# Patient Record
Sex: Female | Born: 1940 | Race: White | Hispanic: No | Smoking: Never smoker
Health system: Southern US, Community
[De-identification: ages and names within clinical notes are randomized; demographics above are authoritative.]

## PROBLEM LIST (undated history)

## (undated) DIAGNOSIS — I509 Heart failure, unspecified: Secondary | ICD-10-CM

## (undated) DIAGNOSIS — C50912 Malignant neoplasm of unspecified site of left female breast: Secondary | ICD-10-CM

## (undated) DIAGNOSIS — I10 Essential (primary) hypertension: Secondary | ICD-10-CM

## (undated) DIAGNOSIS — E119 Type 2 diabetes mellitus without complications: Secondary | ICD-10-CM

## (undated) HISTORY — PX: CHOLECYSTECTOMY: SHX55

## (undated) HISTORY — PX: BREAST SURGERY: SHX581

## (undated) HISTORY — PX: CARDIAC CATHETERIZATION: SHX172

## (undated) HISTORY — PX: APPENDECTOMY: SHX54

---

## 2003-08-16 ENCOUNTER — Other Ambulatory Visit: Payer: Self-pay

## 2003-08-17 ENCOUNTER — Other Ambulatory Visit: Payer: Self-pay

## 2004-02-25 ENCOUNTER — Ambulatory Visit: Payer: Self-pay | Admitting: Internal Medicine

## 2004-06-16 ENCOUNTER — Ambulatory Visit: Payer: Self-pay | Admitting: Internal Medicine

## 2005-01-07 ENCOUNTER — Ambulatory Visit: Payer: Self-pay | Admitting: Family Medicine

## 2005-07-24 ENCOUNTER — Inpatient Hospital Stay: Payer: Self-pay | Admitting: Internal Medicine

## 2005-07-24 ENCOUNTER — Other Ambulatory Visit: Payer: Self-pay

## 2006-02-05 ENCOUNTER — Ambulatory Visit: Payer: Self-pay | Admitting: Family Medicine

## 2006-08-30 ENCOUNTER — Ambulatory Visit: Payer: Self-pay | Admitting: Family Medicine

## 2006-10-31 ENCOUNTER — Ambulatory Visit: Payer: Self-pay | Admitting: Emergency Medicine

## 2007-02-23 ENCOUNTER — Ambulatory Visit: Payer: Self-pay | Admitting: Emergency Medicine

## 2007-03-02 IMAGING — CR DG CHEST 2V
1 series · 2 of 2 positions shown · non-contrast
Comparison: none

REASON FOR EXAM: CHEST WALL PAIN
   CALL REPORT TO: [phone]
COMMENTS:

[Series 1: view not recorded · 0.17mm/px · 2 of 2 slices shown]
[im 1/2]
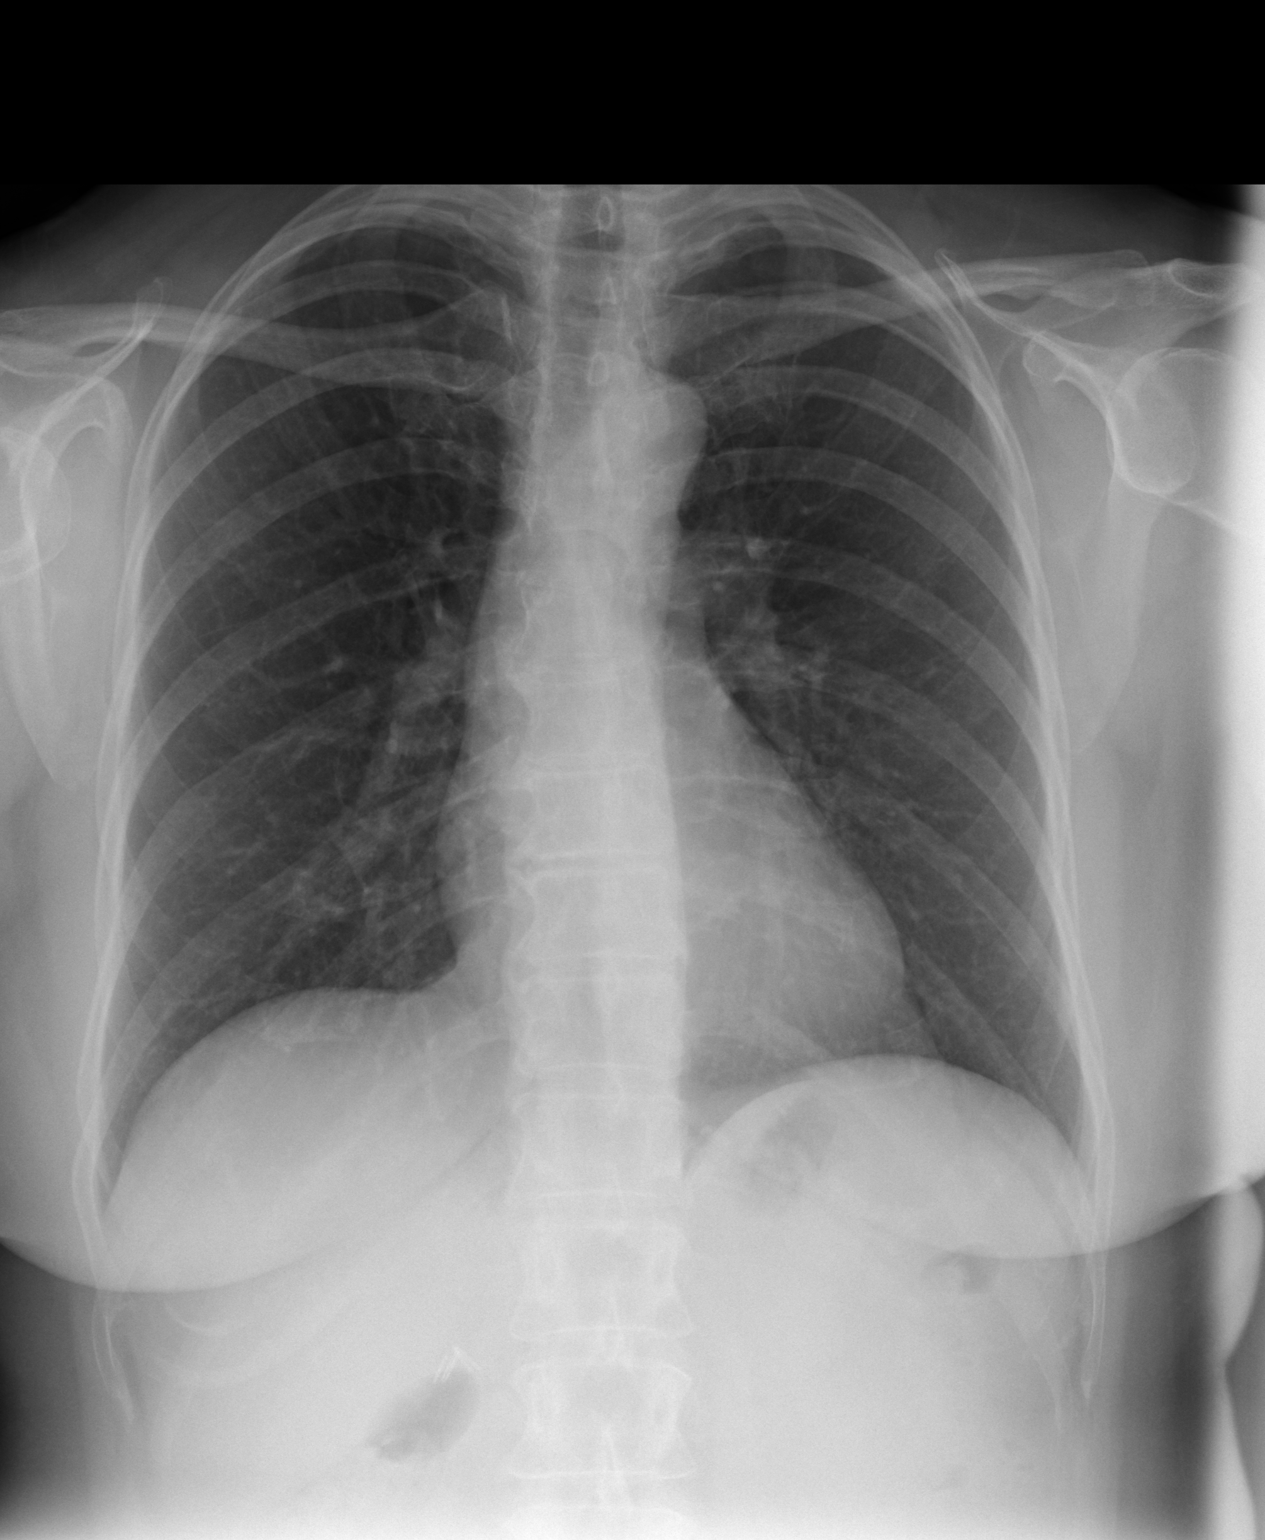
[im 2/2]
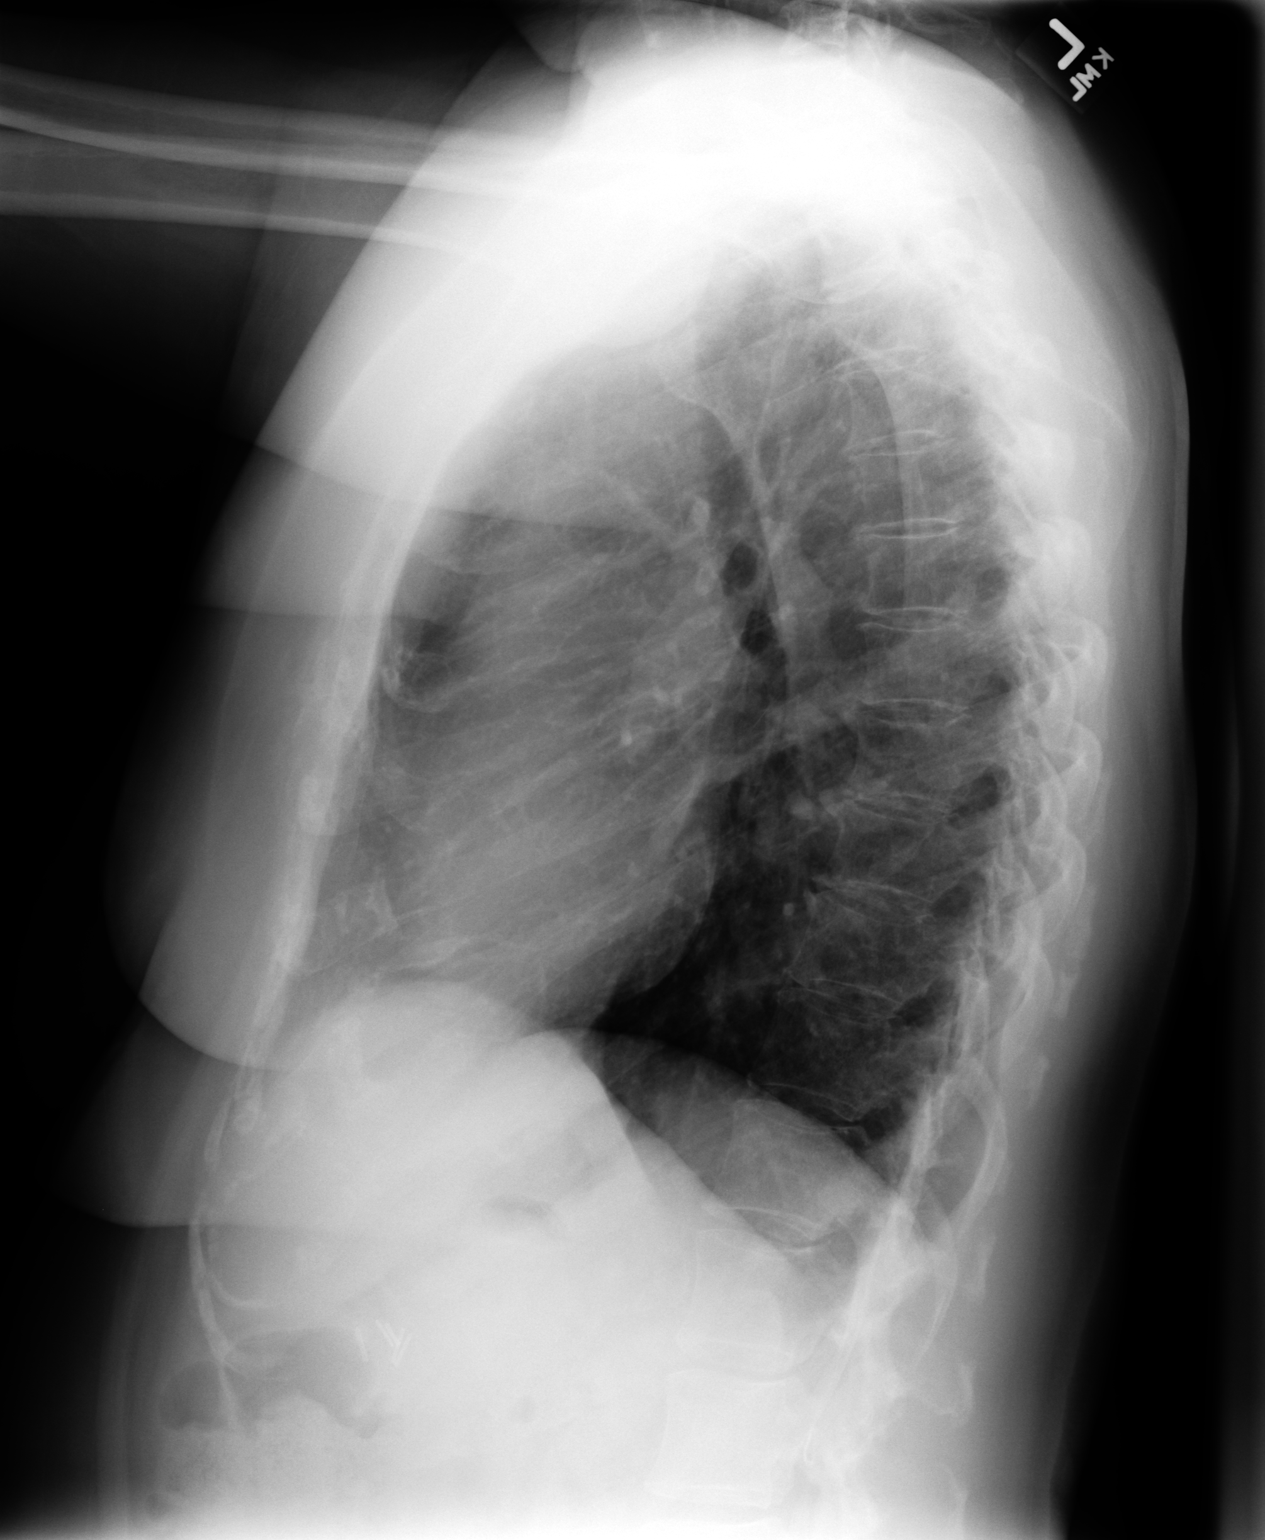

[2 of 2 positions shown; findings below may reference images not displayed]

PROCEDURE:     DXR - DXR CHEST PA (OR AP) AND LATERAL  - January 07, 2005  [DATE]

RESULT:     The current exam is compared to a prior portable AP exam of
08/17/2003.

The current exam shows the lung fields to be clear. No pneumonia,
pneumothorax or pleural effusion is seen. The heart and pulmonary
vasculature are normal in appearance. No acute bony abnormalities are seen.
There is slight hypertrophic spurring at multiple levels of the thoracic
spine.
IMPRESSION: No significant abnormalities are noted.

## 2007-09-01 ENCOUNTER — Ambulatory Visit: Payer: Self-pay | Admitting: Family Medicine

## 2007-09-15 ENCOUNTER — Ambulatory Visit: Payer: Self-pay | Admitting: Family Medicine

## 2008-02-17 ENCOUNTER — Inpatient Hospital Stay: Payer: Self-pay | Admitting: Internal Medicine

## 2008-02-18 ENCOUNTER — Other Ambulatory Visit: Payer: Self-pay

## 2008-02-20 ENCOUNTER — Inpatient Hospital Stay: Payer: Self-pay | Admitting: Internal Medicine

## 2008-02-21 ENCOUNTER — Other Ambulatory Visit: Payer: Self-pay

## 2008-06-26 ENCOUNTER — Ambulatory Visit: Payer: Self-pay | Admitting: Family Medicine

## 2008-09-17 ENCOUNTER — Ambulatory Visit: Payer: Self-pay | Admitting: Family Medicine

## 2009-07-15 ENCOUNTER — Ambulatory Visit: Payer: Self-pay | Admitting: Family Medicine

## 2009-10-16 ENCOUNTER — Ambulatory Visit: Payer: Self-pay | Admitting: Ophthalmology

## 2009-11-27 ENCOUNTER — Ambulatory Visit: Payer: Self-pay | Admitting: Ophthalmology

## 2010-04-15 ENCOUNTER — Ambulatory Visit: Payer: Self-pay | Admitting: Family Medicine

## 2010-04-24 ENCOUNTER — Ambulatory Visit: Payer: Self-pay | Admitting: Family Medicine

## 2010-10-03 ENCOUNTER — Ambulatory Visit: Payer: Self-pay | Admitting: Family Medicine

## 2010-10-15 ENCOUNTER — Ambulatory Visit: Payer: Self-pay | Admitting: Family Medicine

## 2011-05-25 ENCOUNTER — Ambulatory Visit: Payer: Self-pay | Admitting: Internal Medicine

## 2011-05-29 ENCOUNTER — Ambulatory Visit: Payer: Self-pay | Admitting: Family Medicine

## 2011-06-08 ENCOUNTER — Ambulatory Visit: Payer: Self-pay | Admitting: Family Medicine

## 2011-06-30 ENCOUNTER — Observation Stay: Payer: Self-pay | Admitting: Internal Medicine

## 2011-06-30 LAB — PROTIME-INR
INR: 0.9
Prothrombin Time: 12.5 secs (ref 11.5–14.7)

## 2011-06-30 LAB — COMPREHENSIVE METABOLIC PANEL
Anion Gap: 11 (ref 7–16)
Calcium, Total: 9.3 mg/dL (ref 8.5–10.1)
Chloride: 96 mmol/L — ABNORMAL LOW (ref 98–107)
EGFR (African American): >60
EGFR (Non-African Amer.): 54 — ABNORMAL LOW
Potassium: 3 mmol/L — ABNORMAL LOW (ref 3.5–5.1)
SGOT(AST): 15 U/L (ref 15–37)
Total Protein: 7.5 g/dL (ref 6.4–8.2)

## 2011-06-30 LAB — CBC
MCH: 30.5 pg (ref 26.0–34.0)
MCV: 89 fL (ref 80–100)
Platelet: 348 10*3/uL (ref 150–440)
RBC: 4.42 10*6/uL (ref 3.80–5.20)
RDW: 14.6 % — ABNORMAL HIGH (ref 11.5–14.5)
WBC: 12.2 10*3/uL — ABNORMAL HIGH (ref 3.6–11.0)

## 2011-06-30 LAB — TROPONIN I: Troponin-I: <0.02 ng/mL

## 2011-06-30 LAB — CK TOTAL AND CKMB (NOT AT ARMC): CK, Total: 67 U/L (ref 21–215)

## 2011-06-30 LAB — APTT: Activated PTT: 27.5 secs (ref 23.6–35.9)

## 2011-07-01 LAB — CBC WITH DIFFERENTIAL/PLATELET
Basophil %: 0.4 %
HCT: 38.8 % (ref 35.0–47.0)
HGB: 13 g/dL (ref 12.0–16.0)
Lymphocyte #: 2.7 10*3/uL (ref 1.0–3.6)
Lymphocyte %: 26.5 %
MCHC: 33.6 g/dL (ref 32.0–36.0)
MCV: 90 fL (ref 80–100)
Monocyte %: 9.9 %
Neutrophil #: 6.2 10*3/uL (ref 1.4–6.5)
RBC: 4.32 10*6/uL (ref 3.80–5.20)
RDW: 14.1 % (ref 11.5–14.5)

## 2011-07-01 LAB — MAGNESIUM: Magnesium: 1.6 mg/dL — ABNORMAL LOW

## 2011-07-01 LAB — BASIC METABOLIC PANEL
BUN: 20 mg/dL — ABNORMAL HIGH (ref 7–18)
Chloride: 103 mmol/L (ref 98–107)
Co2: 29 mmol/L (ref 21–32)
Creatinine: 0.89 mg/dL (ref 0.60–1.30)
Glucose: 123 mg/dL — ABNORMAL HIGH (ref 65–99)
Osmolality: 287 (ref 275–301)
Potassium: 3.2 mmol/L — ABNORMAL LOW (ref 3.5–5.1)
Sodium: 142 mmol/L (ref 136–145)

## 2011-07-01 LAB — TROPONIN I
Troponin-I: <0.02 ng/mL
Troponin-I: <0.02 ng/mL

## 2011-07-01 LAB — CK TOTAL AND CKMB (NOT AT ARMC)
CK, Total: 64 U/L (ref 21–215)
CK-MB: 0.8 ng/mL (ref 0.5–3.6)
CK-MB: 0.8 ng/mL (ref 0.5–3.6)

## 2011-07-01 LAB — LIPID PANEL
Cholesterol: 178 mg/dL (ref 0–200)
HDL Cholesterol: 34 mg/dL — ABNORMAL LOW (ref 40–60)
Ldl Cholesterol, Calc: 114 mg/dL — ABNORMAL HIGH (ref 0–100)
Triglycerides: 152 mg/dL (ref 0–200)
VLDL Cholesterol, Calc: 30 mg/dL (ref 5–40)

## 2011-07-01 LAB — HEMOGLOBIN A1C: Hemoglobin A1C: 7.2 % — ABNORMAL HIGH (ref 4.2–6.3)

## 2011-08-22 ENCOUNTER — Ambulatory Visit: Payer: Self-pay | Admitting: Family Medicine

## 2011-08-22 LAB — BASIC METABOLIC PANEL
Anion Gap: 12 (ref 7–16)
BUN: 20 mg/dL — ABNORMAL HIGH (ref 7–18)
Calcium, Total: 8.7 mg/dL (ref 8.5–10.1)
Chloride: 101 mmol/L (ref 98–107)
Osmolality: 281 (ref 275–301)
Potassium: 3.7 mmol/L (ref 3.5–5.1)

## 2011-08-22 LAB — URINALYSIS, COMPLETE
Blood: NEGATIVE
Ketone: NEGATIVE
Nitrite: NEGATIVE
Ph: 5 (ref 4.5–8.0)

## 2011-08-22 LAB — CBC WITH DIFFERENTIAL/PLATELET
Basophil #: 0 10*3/uL (ref 0.0–0.1)
Eosinophil #: 0.2 10*3/uL (ref 0.0–0.7)
Eosinophil %: 2.5 %
HGB: 14.6 g/dL (ref 12.0–16.0)
Lymphocyte #: 2 10*3/uL (ref 1.0–3.6)
Lymphocyte %: 21.2 %
MCH: 29.5 pg (ref 26.0–34.0)
MCHC: 33 g/dL (ref 32.0–36.0)
MCV: 90 fL (ref 80–100)
Neutrophil #: 6.5 10*3/uL (ref 1.4–6.5)
Neutrophil %: 67.4 %
Platelet: 357 10*3/uL (ref 150–440)
RBC: 4.94 10*6/uL (ref 3.80–5.20)
WBC: 9.5 10*3/uL (ref 3.6–11.0)

## 2011-08-24 LAB — URINE CULTURE

## 2012-01-01 ENCOUNTER — Ambulatory Visit: Payer: Self-pay | Admitting: Internal Medicine

## 2012-01-01 LAB — COMPREHENSIVE METABOLIC PANEL
Albumin: 4.2 g/dL (ref 3.4–5.0)
Alkaline Phosphatase: 88 U/L (ref 50–136)
Anion Gap: 7 (ref 7–16)
BUN: 19 mg/dL — ABNORMAL HIGH (ref 7–18)
Bilirubin,Total: 0.4 mg/dL (ref 0.2–1.0)
Chloride: 97 mmol/L — ABNORMAL LOW (ref 98–107)
Creatinine: 1.06 mg/dL (ref 0.60–1.30)
Glucose: 95 mg/dL (ref 65–99)
Osmolality: 268 (ref 275–301)
Potassium: 3.4 mmol/L — ABNORMAL LOW (ref 3.5–5.1)
Sodium: 133 mmol/L — ABNORMAL LOW (ref 136–145)
Total Protein: 7.7 g/dL (ref 6.4–8.2)

## 2012-01-01 LAB — CBC WITH DIFFERENTIAL/PLATELET
Basophil %: 0.5 %
Eosinophil %: 5.1 %
HCT: 42.1 % (ref 35.0–47.0)
Lymphocyte %: 29.8 %
MCH: 30.1 pg (ref 26.0–34.0)
Monocyte #: 1 x10 3/mm — ABNORMAL HIGH (ref 0.2–0.9)
Neutrophil #: 5.6 10*3/uL (ref 1.4–6.5)
Neutrophil %: 55 %
Platelet: 377 10*3/uL (ref 150–440)
WBC: 10.1 10*3/uL (ref 3.6–11.0)

## 2012-01-01 LAB — TSH: Thyroid Stimulating Horm: 1.34 u[IU]/mL

## 2012-04-26 ENCOUNTER — Ambulatory Visit: Payer: Self-pay | Admitting: Family Medicine

## 2012-05-25 DIAGNOSIS — C50912 Malignant neoplasm of unspecified site of left female breast: Secondary | ICD-10-CM

## 2012-05-25 HISTORY — DX: Malignant neoplasm of unspecified site of left female breast: C50.912

## 2012-11-16 ENCOUNTER — Emergency Department: Payer: Self-pay | Admitting: Emergency Medicine

## 2012-11-16 LAB — COMPREHENSIVE METABOLIC PANEL
Alkaline Phosphatase: 71 U/L (ref 50–136)
Bilirubin,Total: 0.3 mg/dL (ref 0.2–1.0)
Calcium, Total: 8.7 mg/dL (ref 8.5–10.1)
Chloride: 108 mmol/L — ABNORMAL HIGH (ref 98–107)
Co2: 29 mmol/L (ref 21–32)
EGFR (African American): >60
Glucose: 103 mg/dL — ABNORMAL HIGH (ref 65–99)
Potassium: 3.3 mmol/L — ABNORMAL LOW (ref 3.5–5.1)
SGOT(AST): 14 U/L — ABNORMAL LOW (ref 15–37)
SGPT (ALT): 16 U/L (ref 12–78)
Sodium: 143 mmol/L (ref 136–145)

## 2012-11-16 LAB — CK TOTAL AND CKMB (NOT AT ARMC)
CK, Total: 43 U/L (ref 21–215)
CK-MB: 0.9 ng/mL (ref 0.5–3.6)

## 2012-11-16 LAB — CBC
HCT: 36.6 % (ref 35.0–47.0)
HGB: 12.2 g/dL (ref 12.0–16.0)
MCH: 30.5 pg (ref 26.0–34.0)
MCHC: 33.2 g/dL (ref 32.0–36.0)
Platelet: 302 10*3/uL (ref 150–440)
WBC: 6.4 10*3/uL (ref 3.6–11.0)

## 2012-12-05 ENCOUNTER — Ambulatory Visit: Payer: Self-pay | Admitting: Hematology and Oncology

## 2012-12-22 ENCOUNTER — Ambulatory Visit: Payer: Self-pay

## 2012-12-26 ENCOUNTER — Ambulatory Visit: Payer: Self-pay | Admitting: Hematology and Oncology

## 2012-12-26 LAB — COMPREHENSIVE METABOLIC PANEL
Albumin: 3.7 g/dL (ref 3.4–5.0)
Alkaline Phosphatase: 96 U/L (ref 50–136)
Anion Gap: 11 (ref 7–16)
BUN: 14 mg/dL (ref 7–18)
Bilirubin,Total: 0.2 mg/dL (ref 0.2–1.0)
Co2: 27 mmol/L (ref 21–32)
Creatinine: 0.92 mg/dL (ref 0.60–1.30)
EGFR (African American): >60
EGFR (Non-African Amer.): >60
Glucose: 136 mg/dL — ABNORMAL HIGH (ref 65–99)
Potassium: 3.5 mmol/L (ref 3.5–5.1)
Sodium: 141 mmol/L (ref 136–145)

## 2012-12-26 LAB — CBC CANCER CENTER
Basophil #: 0.1 x10 3/mm (ref 0.0–0.1)
Basophil %: 1.1 %
Eosinophil #: 0.5 x10 3/mm (ref 0.0–0.7)
Eosinophil %: 7.3 %
HCT: 38.3 % (ref 35.0–47.0)
Lymphocyte #: 2.5 x10 3/mm (ref 1.0–3.6)
Lymphocyte %: 34.5 %
MCH: 30.3 pg (ref 26.0–34.0)
MCHC: 34.2 g/dL (ref 32.0–36.0)
MCV: 89 fL (ref 80–100)
Monocyte #: 0.5 x10 3/mm (ref 0.2–0.9)
Monocyte %: 6.5 %
Neutrophil %: 50.6 %
Platelet: 278 x10 3/mm (ref 150–440)
RDW: 14.9 % — ABNORMAL HIGH (ref 11.5–14.5)
WBC: 7.2 x10 3/mm (ref 3.6–11.0)

## 2013-01-02 LAB — BASIC METABOLIC PANEL
Anion Gap: 9 (ref 7–16)
BUN: 15 mg/dL (ref 7–18)
Calcium, Total: 9.5 mg/dL (ref 8.5–10.1)
Chloride: 104 mmol/L (ref 98–107)
Co2: 30 mmol/L (ref 21–32)
Creatinine: 0.82 mg/dL (ref 0.60–1.30)
EGFR (Non-African Amer.): >60
Osmolality: 285 (ref 275–301)
Sodium: 143 mmol/L (ref 136–145)

## 2013-01-16 LAB — COMPREHENSIVE METABOLIC PANEL
Albumin: 3.7 g/dL (ref 3.4–5.0)
Alkaline Phosphatase: 95 U/L (ref 50–136)
Anion Gap: 11 (ref 7–16)
Bilirubin,Total: 0.2 mg/dL (ref 0.2–1.0)
Calcium, Total: 9.2 mg/dL (ref 8.5–10.1)
Chloride: 104 mmol/L (ref 98–107)
Co2: 27 mmol/L (ref 21–32)
Creatinine: 0.97 mg/dL (ref 0.60–1.30)
EGFR (African American): >60
EGFR (Non-African Amer.): 58 — ABNORMAL LOW
Osmolality: 286 (ref 275–301)
Potassium: 3.5 mmol/L (ref 3.5–5.1)
SGOT(AST): 17 U/L (ref 15–37)
Sodium: 142 mmol/L (ref 136–145)
Total Protein: 7 g/dL (ref 6.4–8.2)

## 2013-01-16 LAB — CBC CANCER CENTER
Basophil #: 0.1 x10 3/mm (ref 0.0–0.1)
Eosinophil %: 6.3 %
HCT: 41.2 % (ref 35.0–47.0)
Lymphocyte #: 2.1 x10 3/mm (ref 1.0–3.6)
Lymphocyte %: 29.4 %
MCH: 28.7 pg (ref 26.0–34.0)
MCHC: 32.6 g/dL (ref 32.0–36.0)
Monocyte #: 0.5 x10 3/mm (ref 0.2–0.9)
Monocyte %: 6.9 %
Neutrophil #: 4 x10 3/mm (ref 1.4–6.5)
Neutrophil %: 56.6 %
RBC: 4.68 10*6/uL (ref 3.80–5.20)
RDW: 14.6 % — ABNORMAL HIGH (ref 11.5–14.5)
WBC: 7.1 x10 3/mm (ref 3.6–11.0)

## 2013-01-23 ENCOUNTER — Ambulatory Visit: Payer: Self-pay | Admitting: Hematology and Oncology

## 2013-02-06 LAB — CBC CANCER CENTER
Basophil #: 0.1 x10 3/mm (ref 0.0–0.1)
Basophil %: 0.9 %
Eosinophil %: 6.9 %
HCT: 40.4 % (ref 35.0–47.0)
HGB: 13.3 g/dL (ref 12.0–16.0)
Lymphocyte #: 2 x10 3/mm (ref 1.0–3.6)
Lymphocyte %: 32.1 %
MCH: 28.5 pg (ref 26.0–34.0)
Monocyte #: 0.4 x10 3/mm (ref 0.2–0.9)
Monocyte %: 6.9 %
Neutrophil #: 3.4 x10 3/mm (ref 1.4–6.5)
Neutrophil %: 53.2 %
Platelet: 281 x10 3/mm (ref 150–440)
RBC: 4.68 10*6/uL (ref 3.80–5.20)
RDW: 14.4 % (ref 11.5–14.5)

## 2013-02-06 LAB — BASIC METABOLIC PANEL
BUN: 15 mg/dL (ref 7–18)
Calcium, Total: 8.9 mg/dL (ref 8.5–10.1)
Co2: 27 mmol/L (ref 21–32)
Creatinine: 0.8 mg/dL (ref 0.60–1.30)
Osmolality: 286 (ref 275–301)
Potassium: 4 mmol/L (ref 3.5–5.1)

## 2013-02-13 ENCOUNTER — Ambulatory Visit: Payer: Self-pay

## 2013-02-13 DIAGNOSIS — R42 Dizziness and giddiness: Secondary | ICD-10-CM

## 2013-02-22 ENCOUNTER — Ambulatory Visit: Payer: Self-pay | Admitting: Hematology and Oncology

## 2013-02-27 LAB — CBC CANCER CENTER
HGB: 13.6 g/dL (ref 12.0–16.0)
Lymphocyte %: 31.5 %
MCH: 28.1 pg (ref 26.0–34.0)
Monocyte %: 7.1 %
Neutrophil %: 54.1 %
Platelet: 275 x10 3/mm (ref 150–440)
RBC: 4.83 10*6/uL (ref 3.80–5.20)
WBC: 6 x10 3/mm (ref 3.6–11.0)

## 2013-02-27 LAB — BASIC METABOLIC PANEL
Anion Gap: 9 (ref 7–16)
Calcium, Total: 8.8 mg/dL (ref 8.5–10.1)
Chloride: 103 mmol/L (ref 98–107)
EGFR (African American): >60
EGFR (Non-African Amer.): >60
Glucose: 136 mg/dL — ABNORMAL HIGH (ref 65–99)
Osmolality: 282 (ref 275–301)

## 2013-03-20 LAB — CBC CANCER CENTER
Eosinophil %: 6.6 %
HCT: 40.8 % (ref 35.0–47.0)
HGB: 13.2 g/dL (ref 12.0–16.0)
Lymphocyte %: 36.2 %
MCH: 28.3 pg (ref 26.0–34.0)
MCV: 87 fL (ref 80–100)
Monocyte %: 6 %
Platelet: 295 x10 3/mm (ref 150–440)
RBC: 4.68 10*6/uL (ref 3.80–5.20)
RDW: 15.3 % — ABNORMAL HIGH (ref 11.5–14.5)
WBC: 5.8 x10 3/mm (ref 3.6–11.0)

## 2013-03-20 LAB — BASIC METABOLIC PANEL
BUN: 18 mg/dL (ref 7–18)
Chloride: 103 mmol/L (ref 98–107)
Co2: 28 mmol/L (ref 21–32)
Potassium: 3.8 mmol/L (ref 3.5–5.1)
Sodium: 140 mmol/L (ref 136–145)

## 2013-03-25 ENCOUNTER — Ambulatory Visit: Payer: Self-pay | Admitting: Hematology and Oncology

## 2013-04-10 LAB — BASIC METABOLIC PANEL
Calcium, Total: 9 mg/dL (ref 8.5–10.1)
Chloride: 103 mmol/L (ref 98–107)
Co2: 27 mmol/L (ref 21–32)
Creatinine: 0.95 mg/dL (ref 0.60–1.30)
EGFR (Non-African Amer.): 59 — ABNORMAL LOW
Osmolality: 285 (ref 275–301)
Sodium: 141 mmol/L (ref 136–145)

## 2013-04-10 LAB — CBC CANCER CENTER
Basophil #: 0 x10 3/mm (ref 0.0–0.1)
HCT: 41.9 % (ref 35.0–47.0)
HGB: 13.6 g/dL (ref 12.0–16.0)
Lymphocyte %: 36 %
MCH: 28.8 pg (ref 26.0–34.0)
MCV: 89 fL (ref 80–100)
Monocyte %: 6.7 %
Neutrophil #: 3 x10 3/mm (ref 1.4–6.5)
Neutrophil %: 51.4 %
RBC: 4.71 10*6/uL (ref 3.80–5.20)

## 2013-04-24 ENCOUNTER — Ambulatory Visit: Payer: Self-pay | Admitting: Hematology and Oncology

## 2013-05-01 LAB — BASIC METABOLIC PANEL
Anion Gap: 7 (ref 7–16)
BUN: 14 mg/dL (ref 7–18)
Calcium, Total: 9.1 mg/dL (ref 8.5–10.1)
Chloride: 103 mmol/L (ref 98–107)
Co2: 29 mmol/L (ref 21–32)
Creatinine: 0.92 mg/dL (ref 0.60–1.30)
EGFR (Non-African Amer.): >60
Glucose: 113 mg/dL — ABNORMAL HIGH (ref 65–99)
Potassium: 4.2 mmol/L (ref 3.5–5.1)

## 2013-05-01 LAB — CBC CANCER CENTER
Eosinophil #: 0.4 x10 3/mm (ref 0.0–0.7)
Eosinophil %: 4.4 %
HCT: 36.9 % (ref 35.0–47.0)
HGB: 12.5 g/dL (ref 12.0–16.0)
Lymphocyte #: 1.9 x10 3/mm (ref 1.0–3.6)
Lymphocyte %: 20.4 %
MCH: 29.9 pg (ref 26.0–34.0)
MCHC: 33.9 g/dL (ref 32.0–36.0)
Monocyte #: 0.9 x10 3/mm (ref 0.2–0.9)
Neutrophil %: 64.5 %
Platelet: 354 x10 3/mm (ref 150–440)
RDW: 14 % (ref 11.5–14.5)
WBC: 9.3 x10 3/mm (ref 3.6–11.0)

## 2013-05-25 ENCOUNTER — Ambulatory Visit: Payer: Self-pay | Admitting: Hematology and Oncology

## 2013-06-26 ENCOUNTER — Ambulatory Visit: Payer: Self-pay | Admitting: Hematology and Oncology

## 2013-07-14 ENCOUNTER — Ambulatory Visit: Payer: Self-pay

## 2013-07-14 ENCOUNTER — Emergency Department: Payer: Self-pay | Admitting: Emergency Medicine

## 2013-07-14 LAB — BASIC METABOLIC PANEL
Anion Gap: 7 (ref 7–16)
BUN: 14 mg/dL (ref 7–18)
CALCIUM: 9.5 mg/dL (ref 8.5–10.1)
CHLORIDE: 106 mmol/L (ref 98–107)
Co2: 25 mmol/L (ref 21–32)
Creatinine: 0.84 mg/dL (ref 0.60–1.30)
EGFR (Non-African Amer.): >60
Glucose: 108 mg/dL — ABNORMAL HIGH (ref 65–99)
OSMOLALITY: 277 (ref 275–301)
POTASSIUM: 3.9 mmol/L (ref 3.5–5.1)
Sodium: 138 mmol/L (ref 136–145)

## 2013-07-14 LAB — URINALYSIS, COMPLETE
BACTERIA: NONE SEEN
BILIRUBIN, UR: NEGATIVE
BLOOD: NEGATIVE
Glucose,UR: NEGATIVE mg/dL (ref 0–75)
KETONE: NEGATIVE
LEUKOCYTE ESTERASE: NEGATIVE
NITRITE: NEGATIVE
PROTEIN: NEGATIVE
Ph: 6 (ref 4.5–8.0)
Specific Gravity: 1.009 (ref 1.003–1.030)
Squamous Epithelial: <1
WBC UR: <1 /HPF (ref 0–5)

## 2013-07-14 LAB — TROPONIN I: Troponin-I: <0.02 ng/mL

## 2013-07-14 LAB — CBC
HCT: 43.8 % (ref 35.0–47.0)
HGB: 14.2 g/dL (ref 12.0–16.0)
MCH: 28 pg (ref 26.0–34.0)
MCHC: 32.5 g/dL (ref 32.0–36.0)
MCV: 86 fL (ref 80–100)
Platelet: 316 10*3/uL (ref 150–440)
RBC: 5.08 10*6/uL (ref 3.80–5.20)
RDW: 13.5 % (ref 11.5–14.5)
WBC: 8.7 10*3/uL (ref 3.6–11.0)

## 2013-07-23 ENCOUNTER — Ambulatory Visit: Payer: Self-pay | Admitting: Hematology and Oncology

## 2013-08-23 ENCOUNTER — Ambulatory Visit: Payer: Self-pay | Admitting: Hematology and Oncology

## 2013-10-02 ENCOUNTER — Ambulatory Visit: Payer: Self-pay | Admitting: Hematology and Oncology

## 2013-10-23 ENCOUNTER — Ambulatory Visit: Payer: Self-pay | Admitting: Hematology and Oncology

## 2014-04-26 ENCOUNTER — Ambulatory Visit: Payer: Self-pay | Admitting: Family Medicine

## 2014-08-14 ENCOUNTER — Ambulatory Visit: Payer: Self-pay | Admitting: Family Medicine

## 2014-08-28 ENCOUNTER — Ambulatory Visit
Admit: 2014-08-28 | Disposition: A | Discharge status: Home / Self Care | Payer: Self-pay | Attending: Otolaryngology | Admitting: Otolaryngology

## 2014-09-16 NOTE — Discharge Summary (Signed)
PATIENT NAME:  Lauren Becker, Lauren Becker MR#:  [id number] DATE OF BIRTH:  [birth year]  DATE OF ADMISSION:  06/30/2011 DATE OF DISCHARGE:  07/01/2011  ADMITTING DIAGNOSIS: Chest pressure.   DISCHARGE DIAGNOSES:  1. Chest pressure felt to be noncardiac status post evaluation by Dr. Clayborn Bigness as well as stress test, which was negative.  2. Electrolyte imbalances due to diuretic therapy, now improved.  3. Mild dehydration.  4. Coronary artery disease with stent in 2009.  5. Migraine headaches.  6. History of skin cancer.  7. Hypertension.  8. Gastroesophageal reflux disease.  9. Carpal tunnel syndrome.  10. Status post cholecystectomy.  11. Status post appendectomy.  12. Status post cyst removal from the neck.   PERTINENT LABORATORY EVALUATIONS: Chest x-ray showed no evidence of acute cardiopulmonary processes. Glucose 117, BUN 21, creatinine 1.02, sodium 137, potassium 3, chloride 96. WBC count was 12.2. Her troponins times three sets were negative. EKG showed normal sinus rhythm, low voltage QRS. No other abnormalities.   HOSPITAL COURSE: Please see History and Physical done by the admitting physician. The patient is a 74 year old white female who presented with history of coronary artery disease with stent to the LAD in 2009, diabetes, hypertension, and gastroesophageal reflux disease. She was at work when she developed chest heaviness with fluttering. She became very tired and fatigued and developed nausea. Therefore, she went to see her primary care physician who gave her nitroglycerin and subsequently the patient developed hypotension and passed out. She was sent to the ED for evaluation. In the ED, the patient was awake and alert. She was a little dehydrated. Blood pressure was elevated. Her EKG was nonrevealing. Her cardiac enzymes were negative. She was admitted for further evaluation and treatment. She was seen in consultation by cardiology and had a stress test which was negative. Her cardiac enzymes  remained negative overnight. Her stress test was negative and she was cleared by Dr. Clayborn Bigness to be discharged home.   DISCHARGE MEDICATIONS:  1. Labetalol 50 mg, 1 tab p.o. daily.  2. Plavix 75 p.o. daily.  3. Aspirin 325 daily.  4. Lasix 20 daily.  5. Prevacid 1 tab p.o. daily.  6. Metformin 500, 1 tab p.o. daily.  7. Kay Ciel 10 mEq, 4 tabs daily instead of 3 tabs, which she was on previously.  The patient is told not to take Lasix until seen by Dr. Clemmie Krill.   DIET: Low sodium ADA diet.   TIMEFRAME FOR FOLLOWUP: 1 to 2 weeks with Dr. Clemmie Krill.  Follow up with Dr. Clayborn Bigness on 07/03/2011.   TIME SPENT:  35 minutes.  ____________________________ Lafonda Mosses Posey Pronto, MD shp:bjt D: 07/03/2011 12:55:24 ET T: 07/03/2011 13:10:04 ET JOB#: [id number]  cc: Damarko Stitely H. Posey Pronto, MD, <Dictator> Valetta Close, MD Alric Seton MD ELECTRONICALLY SIGNED 07/11/2011 10:01

## 2014-09-16 NOTE — H&P (Signed)
PATIENT NAME:  Lauren Becker, Lauren Becker MR#:  [id number] DATE OF BIRTH:  12-Sep-1940  DATE OF ADMISSION:  06/30/2011  REFERRING PHYSICIAN: ER physician, Dr. Marta Antu PRIMARY CARE PHYSICIAN: Dr. Clemmie Krill CARDIOLOGIST: Dr. Clayborn Bigness  CHIEF COMPLAINT: Chest heaviness.   HISTORY OF PRESENT ILLNESS: Patient is a 74 year old female with past medical history of coronary artery disease status post stent to LAD in 2009, diabetes, hypertension, gastroesophageal reflux disease, who was at work today when she developed sudden onset of chest heaviness with fluttering in her chest. Subsequently, she became very tired and fatigued, and developed nausea. She felt that she could not take a deep breath. Patient was supposed to see Dr. Clayborn Bigness last week, however, was unable to do so. She tried calling him today but was unable to be seen until Friday therefore she went to see her primary care physician who gave her a nitroglycerin for her chest heaviness and subsequently patient developed hypertension and passed out in the primary care physician's office therefore she was sent to ER for evaluation. Patient reports that her chest heaviness is a little bit better currently. She has had stress test at Dr. Etta Quill office after her stent in 2009; the last one was last year. After her stent placement patient had no further symptoms of angina.   ALLERGIES: Codeine, Vicodin.  PAST MEDICAL HISTORY: 1. Migraine.  2. Coronary artery disease status post stent to LAD in 2009.  3. Diabetes. 4. Skin cancer. 5. Hypertension.  6. Gastroesophageal reflux disease.  7. Carpal tunnel syndrome.   PAST SURGICAL HISTORY:  1. Stents 2009. 2. Cholecystectomy.  3. Appendectomy.  4. Cyst removal from the neck.   SOCIAL HISTORY: Denies any history of smoking, alcohol or drug abuse. She lives alone. 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   FAMILY HISTORY: Mother had diabetes and had MI in her 40s. Two brothers had  MI in their 74s and 65s. Father had throat cancer.  MEDICATIONS: Patient did not bring a list of her medications. As per prescription writer she is taking:  1. Plavix 75 mg daily.  2. Aspirin 325 mg daily.  3. Cardizem 300 mg daily.  4. Lipitor 200 mg b.i.d.  5. Tekturna 150 mg daily.  6. Imdur 130 mg daily.  7. Lasix 20 mg daily.  8. Prevacid 1 tablet daily.  9. KCl 3 tablets daily. 10. HCTZ 1 tablet daily.  11. Patient also reports that she takes metformin for her diabetes, does not know the dose.   REVIEW OF SYSTEMS: CONSTITUTIONAL: Denies any fever. Reports weakness and fatigue. EYES: Denies any blurred or double vision. Wears glasses. ENT: Denies any tinnitus, ear pain. RESPIRATORY: Denies any cough, wheezing. CARDIOVASCULAR: Reports palpitations and chest pain. GASTROINTESTINAL: Reported nausea earlier. Denies any vomiting, diarrhea, abdominal pain. GENITOURINARY: Denies any dysuria, hematuria. ENDOCRINE: Denies any polyuria, nocturia. HEME/LYMPH: Denies any anemia, easy bruisability. INTEGUMENT: Denies any acne, rash. MUSCULOSKELETAL: Denies any swelling, gout. NEUROLOGICAL: Denies any numbness, weakness. PSYCH: Denies any anxiety, nervousness.   PHYSICAL EXAMINATION:  VITAL SIGNS: Temperature afebrile, heart rate 68, respiratory rate 18, blood pressure 157/81, pulse ox 100% room air.    GENERAL: Patient is a 74 year old Caucasian female sitting comfortably in bed, not in acute distress.   HEAD: Atraumatic, normocephalic.   EYES: No pallor, icterus, or cyanosis. Pupils are equal, round, and reactive to light and accommodation. Extraocular movements intact.   ENT: Wet mucous membranes. No oropharyngeal erythema or thrush.   NECK: Supple. No masses.  No JVD. No thyromegaly. No lymphadenopathy.   CHEST WALL: No tenderness to palpation. Not using accessory muscles of respiration. No intercostal muscle retractions.   LUNGS: Bilaterally clear to auscultation. No wheezing, rales or  rhonchi.  CARDIOVASCULAR: S1, S2 regular. No murmurs, rubs, gallops.   ABDOMEN: Soft, nontender, nondistended. No guarding. No rigidity. No organomegaly. Normal bowel sounds.   SKIN: No rashes or lesions.   PERIPHERIES: No pedal edema. 2+ pedal pulses.    MUSCULOSKELETAL: No cyanosis or clubbing.   NEUROLOGICAL: Awake, alert, oriented x3. Nonfocal neurological exam. Cranial nerves grossly intact.   PSYCH: Normal mood and affect.   LABORATORY, DIAGNOSTIC, AND RADIOLOGICAL DATA: Chest x-ray shows no evidence of acute cardiopulmonary disease. Cardiac enzymes negative. Glucose 117, BUN 21, creatinine 1.02, sodium 137, potassium 3, chloride 96, white count 12.2     ASSESSMENT AND PLAN: 74 year old female with past medical history of coronary artery disease, diabetes, hypertension presents with chest heaviness.  1. Chest heaviness/chest pain. Will admit the patient to the hospital as observation. Check serial cardiac enzymes. Obtain a cardiology consultation, inpatient stress test. Will continue patient's aspirin, Plavix, beta blocker, Imdur and Tekturna. Will start on p.r.n. nitroglycerin. Will check a fasting lipid profile.  2. Diabetes. Patient reports that she is taking metformin. Will hold metformin for the time being. Place on insulin sliding scale and diabetic diet. Check a hemoglobin A1c. 3. Mild dehydration with a BUN of 21. Will give a liter of fluid and reassess.  4. Hypokalemia likely due to diuretic use. Will supplement now and place on scheduled doses of potassium.   5. Leukocytosis, probably reactive. Will monitor.  6. History of gastroesophageal reflux disease. Will continue PPI.  7. Hypertension. Patient became hypotensive and passed out after receiving nitroglycerin with a drop in her blood pressure at the PCPs office. Currently her pressure is better controlled. Will continue her antihypertensive medications.   Reviewed all medical records, discussed with the ED physician,  discussed with the patient and her family the plan of care and management.   TIME SPENT: 75 minutes.  ____________________________ Cherre Huger, MD sp:cms D: 06/30/2011 18:07:56 ET T: 07/01/2011 06:00:05 ET JOB#: [id number]  cc: Cherre Huger, MD, <Dictator> Valetta Close, MD Cherre Huger MD ELECTRONICALLY SIGNED 07/01/2011 15:26

## 2014-09-16 NOTE — Consult Note (Signed)
PATIENT NAME:  Lauren Becker, Lauren Becker MR#:  [id number] DATE OF BIRTH:  30-Jul-1940  DATE OF CONSULTATION:  06/30/2011  REFERRING PHYSICIAN:  Dr. Karsten Fells with [facility]  CONSULTING PHYSICIAN:  Leyton Brownlee D. Clayborn Bigness, MD  PRIMARY CARE PHYSICIAN: Dr. Bernie Covey   INDICATION: Chest pain, coronary disease, angina.    HISTORY OF PRESENT ILLNESS: Ms. Dolinar is a 74 year old white female with history of known coronary disease, hypertension, hyperlipidemia, diabetes, reflux who developed sudden onset of chest pain and fluttering in chest, substernally, became tired, fatigued, weak with some nausea but no vomiting, was unable to take a deep breath and got short of breath. She finally came to Emergency Room after seeing her primary care, was given nitroglycerin and then referred to the Emergency Room for evaluation and management.   REVIEW OF SYSTEMS: No blackout spells, syncope. She has had nausea. No vomiting. No fever or chills. No sweats. No weight loss. No weight gain. No hemoptysis, hematemesis. No bright red blood per rectum. No vision change or hearing change. No sputum production or cough.   PAST MEDICAL HISTORY:  1. Migraines. 2. Coronary artery disease. 3. Diabetes.  4. Skin cancer. 5. Hypertension. 6. Reflux. 7. Carpal tunnel syndrome.   PAST SURGICAL HISTORY:  1. Angioplasty and stenting. 2. Cholecystectomy.  3. Appendectomy.  4. Cyst removal from her neck.   FAMILY HISTORY: Diabetes, myocardial infarction, throat cancer.   SOCIAL HISTORY: Lives alone. Works part-time. No smoking, alcohol consumption.   MEDICATIONS:  1. Plavix 75 a day. 2. Aspirin 325 a day.  3. Cardizem 300 mg a day.  4. Lipitor 20 mg a day.  5. Tekturna 150 a day.  6. Imdur 60 mg daily.  7. Lasix 20 a day.  8. Prevacid 1 tablet a day.  9. KCL 3 tablets a day.  10. HCTZ 1 mg 1 tablet a day.  11. She is on metformin for diabetes.   ALLERGIES: None.  PHYSICAL EXAMINATION:  VITAL SIGNS: Blood pressure 160/80,  pulse 70, respiratory rate 16, afebrile.   HEENT: Normocephalic, atraumatic. Pupils equal, reactive to light.   NECK: Supple. No significant jugular venous distention, bruits, adenopathy.   LUNGS: Clear to auscultation and percussion. No significant wheezing, rhonchi, or rales.   HEART: Regular rate and rhythm, 2/6 systolic ejection murmur left sternal border.   ABDOMEN: Benign.   EXTREMITIES: Within normal limits.   NEUROLOGIC: Intact.   SKIN: Normal   LABORATORY, DIAGNOSTIC AND RADIOLOGICAL DATA: Chest x-ray negative. Glucose 117, BUN 21, creatinine 1.02, sodium 137, potassium 3, chloride 98, white count 12, otherwise normal.   ASSESSMENT:  1. Unstable angina.  2. Angina.  3. Coronary artery disease.  4. Diabetes.  5. Hypertension.  6. Hyperlipidemia.  7. Reflux.   PLAN: Agree with admit. Rule out for myocardial infarction. Continue current medications. Lipid management. Blood pressure control. Proceed with a functional study to evaluate for ischemia. Hopefully the patient's study will be normal and we can treat the patient medically. Continue diabetes management. Replace potassium. Continue reflux medications. Strict blood pressure control. Would continue with conservative cardiac evaluation noninvasive at this point.  ____________________________ Loran Senters. Clayborn Bigness, MD ddc:cms D: 07/06/2011 14:44:51 ET T: 07/06/2011 15:08:22 ET JOB#: [id number]  cc: Samiha Denapoli D. Clayborn Bigness, MD, <Dictator> Yolonda Kida MD ELECTRONICALLY SIGNED 07/23/2011 7:34

## 2015-01-01 ENCOUNTER — Ambulatory Visit
Admission: EM | Admit: 2015-01-01 | Discharge: 2015-01-01 | Disposition: A | Discharge status: Home / Self Care | Payer: Medicare Other | Attending: Family Medicine | Admitting: Family Medicine

## 2015-01-01 ENCOUNTER — Encounter: Payer: Self-pay | Admitting: Emergency Medicine

## 2015-01-01 DIAGNOSIS — R197 Diarrhea, unspecified: Secondary | ICD-10-CM | POA: Insufficient documentation

## 2015-01-01 DIAGNOSIS — N390 Urinary tract infection, site not specified: Secondary | ICD-10-CM | POA: Diagnosis not present

## 2015-01-01 DIAGNOSIS — I10 Essential (primary) hypertension: Secondary | ICD-10-CM | POA: Diagnosis not present

## 2015-01-01 DIAGNOSIS — Z7982 Long term (current) use of aspirin: Secondary | ICD-10-CM | POA: Diagnosis not present

## 2015-01-01 HISTORY — DX: Essential (primary) hypertension: I10

## 2015-01-01 LAB — CBC WITH DIFFERENTIAL/PLATELET
Basophils Absolute: 0.1 10*3/uL (ref 0–0.1)
Basophils Relative: 0 %
Eosinophils Absolute: 0.3 10*3/uL (ref 0–0.7)
Eosinophils Relative: 2 %
HCT: 40.2 % (ref 35.0–47.0)
Hemoglobin: 13.5 g/dL (ref 12.0–16.0)
Lymphocytes Relative: 12 %
Lymphs Abs: 2 10*3/uL (ref 1.0–3.6)
MCH: 28.6 pg (ref 26.0–34.0)
MCHC: 33.6 g/dL (ref 32.0–36.0)
MCV: 85 fL (ref 80.0–100.0)
Monocytes Absolute: 1.2 10*3/uL — ABNORMAL HIGH (ref 0.2–0.9)
Monocytes Relative: 7 %
Neutro Abs: 13.6 10*3/uL — ABNORMAL HIGH (ref 1.4–6.5)
Neutrophils Relative %: 79 %
Platelets: 348 10*3/uL (ref 150–440)
RBC: 4.72 MIL/uL (ref 3.80–5.20)
RDW: 13.5 % (ref 11.5–14.5)
WBC: 17.1 10*3/uL — ABNORMAL HIGH (ref 3.6–11.0)

## 2015-01-01 LAB — COMPREHENSIVE METABOLIC PANEL
ALT: 14 U/L (ref 14–54)
AST: 15 U/L (ref 15–41)
Albumin: 4.3 g/dL (ref 3.5–5.0)
Alkaline Phosphatase: 69 U/L (ref 38–126)
Anion gap: 11 (ref 5–15)
BUN: 12 mg/dL (ref 6–20)
CO2: 26 mmol/L (ref 22–32)
Calcium: 9.3 mg/dL (ref 8.9–10.3)
Chloride: 101 mmol/L (ref 101–111)
Creatinine, Ser: 0.89 mg/dL (ref 0.44–1.00)
GFR calc Af Amer: >60 mL/min (ref >60)
GFR calc non Af Amer: >60 mL/min (ref >60)
Glucose, Bld: 127 mg/dL — ABNORMAL HIGH (ref 65–99)
Potassium: 3.4 mmol/L — ABNORMAL LOW (ref 3.5–5.1)
Sodium: 138 mmol/L (ref 135–145)
Total Bilirubin: 0.7 mg/dL (ref 0.3–1.2)
Total Protein: 7.6 g/dL (ref 6.5–8.1)

## 2015-01-01 LAB — URINALYSIS COMPLETE WITH MICROSCOPIC (ARMC ONLY)
Bilirubin Urine: NEGATIVE
Glucose, UA: NEGATIVE mg/dL
Ketones, ur: NEGATIVE mg/dL
Nitrite: NEGATIVE
Protein, ur: 30 mg/dL — AB
Specific Gravity, Urine: 1.005 (ref 1.005–1.030)
pH: 5.5 (ref 5.0–8.0)

## 2015-01-01 MED ORDER — CIPROFLOXACIN HCL 500 MG PO TABS: 500.0000 mg | ORAL_TABLET | Freq: Two times a day (BID) | ORAL | Status: DC

## 2015-01-01 NOTE — ED Notes (Signed)
Pt with abd pain after eating has diarrhea every time x 3 weeks

## 2015-01-01 NOTE — ED Provider Notes (Signed)
CSN: [id number]     Arrival date & time 01/01/15  0929 History   First MD Initiated Contact with Patient 01/01/15 1046     Chief Complaint  Patient presents with  . Diarrhea   (Consider location/radiation/quality/duration/timing/severity/associated sxs/prior Treatment) HPI   This is a 74 year old female who presents with diarrhea for the last 3 weeks. She states that every time she eats she will have diarrhea that is soft but not watery containing a lot of undigested food. In addition she has some nausea and a feeling of fullness in her abdomen but no actual pain. She's been having about 3-4 bowel movements per day. She states she feels hungry but is not eating because of the diarrhea that ensues. States about 30 minutes after eating he will stimulate the bowel movement. Denies any blood or mucus in the stools and allocation his noticed some darkness to the stools. Dr. Clemmie Krill ordered blood work and stool cultures and the office called stating that those were all right but  could not be given any definite instructions since Dr. Clemmie Krill was on vacation. She is status post appendectomy and cholecystectomy in the past and has undergone a left mastectomy. She has not been vomiting. Denies any fever or chills.  Past Medical History  Diagnosis Date  . Hypertension   . Cancer    Past Surgical History  Procedure Laterality Date  . Appendectomy    . Breast surgery    . Cholecystectomy     Family History  Problem Relation Age of Onset  . Stroke Mother   . Cancer Father    History  Substance Use Topics  . Smoking status: Never Smoker   . Smokeless tobacco: Not on file  . Alcohol Use: No   OB History    No data available     Review of Systems  Constitutional: Positive for appetite change and fatigue. Negative for fever and chills.  Gastrointestinal: Positive for nausea and diarrhea. Negative for vomiting, abdominal pain, constipation, blood in stool, abdominal distention, anal bleeding and  rectal pain.  Genitourinary: Positive for dysuria.    Allergies  Codeine and Vicodin  Home Medications   Prior to Admission medications   Medication Sig Start Date End Date Taking? Authorizing Provider  aspirin 325 MG tablet Take 325 mg by mouth daily.   Yes Historical Provider, MD  diazepam (VALIUM) 2 MG tablet Take 2 mg by mouth every 6 (six) hours as needed for anxiety.   Yes Historical Provider, MD  ergocalciferol (VITAMIN D2) 50000 UNITS capsule Take 50,000 Units by mouth once a week.   Yes Historical Provider, MD  furosemide (LASIX) 20 MG tablet Take 20 mg by mouth every morning.   Yes Historical Provider, MD  labetalol (NORMODYNE) 200 MG tablet Take 200 mg by mouth 2 (two) times daily.   Yes Historical Provider, MD  lansoprazole (PREVACID) 30 MG capsule Take 30 mg by mouth daily at 12 noon.   Yes Historical Provider, MD  losartan (COZAAR) 50 MG tablet Take 50 mg by mouth 2 (two) times daily.   Yes Historical Provider, MD  magnesium gluconate (MAGONATE) 500 MG tablet Take 500 mg by mouth every morning.   Yes Historical Provider, MD  metFORMIN (GLUCOPHAGE) 500 MG tablet Take 500 mg by mouth 1 day or 1 dose.   Yes Historical Provider, MD  potassium chloride (KLOR-CON) 20 MEQ packet Take 20 mEq by mouth 2 (two) times daily.   Yes Historical Provider, MD  zolpidem (AMBIEN) 5 MG  tablet Take 5 mg by mouth at bedtime as needed for sleep.   Yes Historical Provider, MD  ciprofloxacin (CIPRO) 500 MG tablet Take 1 tablet (500 mg total) by mouth 2 (two) times daily. 01/01/15   Malachy Chamber Elye Harmsen, PA-C   BP 145/75 mmHg  Pulse 69  Temp(Src) 99.5 F (37.5 C) (Tympanic)  Resp 16  Ht 5\' 4"  (1.626 m)  Wt 148 lb (67.132 kg)  BMI 25.39 kg/m2  SpO2 100% Physical Exam  Constitutional: She is oriented to person, place, and time. She appears well-developed and well-nourished.  HENT:  Head: Normocephalic and atraumatic.  Eyes: Pupils are equal, round, and reactive to light.  Cardiovascular: Normal  rate, regular rhythm and normal heart sounds.  Exam reveals no gallop and no friction rub.   No murmur heard. Pulmonary/Chest: Effort normal. No respiratory distress. She has no wheezes. She has rales.  Abdominal: Soft. She exhibits distension. She exhibits no mass. There is no tenderness. There is no rebound and no guarding.  Bowel sounds are hypotonic and mildly more decreased on the right than the left.  Musculoskeletal: Normal range of motion. She exhibits no edema or tenderness.  Neurological: She is alert and oriented to person, place, and time.  Skin: Skin is warm and dry.  Psychiatric: She has a normal mood and affect. Her behavior is normal. Judgment and thought content normal.  Nursing note and vitals reviewed.   ED Course  Procedures (including critical care time) Labs Review Labs Reviewed  CBC WITH DIFFERENTIAL/PLATELET - Abnormal; Notable for the following:    WBC 17.1 (*)    Neutro Abs 13.6 (*)    Monocytes Absolute 1.2 (*)    All other components within normal limits  COMPREHENSIVE METABOLIC PANEL - Abnormal; Notable for the following:    Potassium 3.4 (*)    Glucose, Bld 127 (*)    All other components within normal limits  URINALYSIS COMPLETEWITH MICROSCOPIC ([facility] ONLY) - Abnormal; Notable for the following:    APPearance CLOUDY (*)    Hgb urine dipstick 2+ (*)    Protein, ur 30 (*)    Leukocytes, UA 2+ (*)    Bacteria, UA MANY (*)    Squamous Epithelial / LPF 0-5 (*)    All other components within normal limits  URINE CULTURE    Imaging Review No results found.   MDM   1. UTI (urinary tract infection) with pyuria   2. Diarrhea    Discharge Medication List as of 01/01/2015  2:13 PM     Plan: 1. Test/x-ray results and diagnosis reviewed with patient 2. rx as per orders; risks, benefits, potential side effects reviewed with patient 3. Recommend supportive treatment with Fluids, advance to BRAT diet after clear liquids tolerated.4. F/u [facility].  The  patient left prior to completion of her workup because she had a prior appointment that she could not miss. After all results were resulted I was able to call the patient at [phone] and spoke with her directly. I gave her the results of the laboratory told her that I was concerned with her white count and with the urinary tract infection that she had as well as an unknown source for her diarrhea. She also had a slight fever of 99.5 or here at the clinic. I told her that she will be placed on Cipro 500 mg twice a day for 7 days for her UTI and hopefully will help with her diarrhea unless it was from a viral source.  The patient was stable and did not appear toxic at the time of the examination nor while I was talking with her on the phone. I told her that she needs to go to the emergency room immediately if she has any increase in her fever, rigors, abdominal pain, bloody diarrhea, or any changes especially if she is not improving after beginning the antibiotic therapy. She is planning on going to the [facility] this week.. She also could follow-up with her PCP,Dr. Clemmie Krill , or return to our clinic any time.   Lorin Picket, PA-C 01/01/15 1459

## 2015-01-02 ENCOUNTER — Ambulatory Visit
Admission: EM | Admit: 2015-01-02 | Discharge: 2015-01-02 | Disposition: A | Discharge status: Home / Self Care | Payer: Medicare Other | Attending: Family Medicine | Admitting: Family Medicine

## 2015-01-02 DIAGNOSIS — A419 Sepsis, unspecified organism: Secondary | ICD-10-CM

## 2015-01-02 DIAGNOSIS — N390 Urinary tract infection, site not specified: Secondary | ICD-10-CM

## 2015-01-02 DIAGNOSIS — R197 Diarrhea, unspecified: Secondary | ICD-10-CM

## 2015-01-02 DIAGNOSIS — N12 Tubulo-interstitial nephritis, not specified as acute or chronic: Secondary | ICD-10-CM

## 2015-01-02 NOTE — ED Notes (Signed)
Report called to Magda Kiel Clarise Cruz RN).

## 2015-01-02 NOTE — ED Notes (Signed)
Pt states "I saw Dr. Clemmie Krill on July 27th with diarrhea and have been sick ever since. I was seen here yesterday, had blood work and urine test. I received antibiotic for uti, I am taking the medication, but I feel sicker."

## 2015-01-02 NOTE — Discharge Instructions (Signed)
Pyelonephritis, Adult Pyelonephritis is a kidney infection. In general, there are 2 main types of pyelonephritis:  Infections that come on quickly without any warning (acute pyelonephritis).  Infections that persist for a long period of time (chronic pyelonephritis). CAUSES  Two main causes of pyelonephritis are:  Bacteria traveling from the bladder to the kidney. This is a problem especially in pregnant women. The urine in the bladder can become filled with bacteria from multiple causes, including:  Inflammation of the prostate gland (prostatitis).  Sexual intercourse in females.  Bladder infection (cystitis).  Bacteria traveling from the bloodstream to the tissue part of the kidney. Problems that may increase your risk of getting a kidney infection include:  Diabetes.  Kidney stones or bladder stones.  Cancer.  Catheters placed in the bladder.  Other abnormalities of the kidney or ureter. SYMPTOMS   Abdominal pain.  Pain in the side or flank area.  Fever.  Chills.  Upset stomach.  Blood in the urine (dark urine).  Frequent urination.  Strong or persistent urge to urinate.  Burning or stinging when urinating. DIAGNOSIS  Your caregiver may diagnose your kidney infection based on your symptoms. A urine sample may also be taken. TREATMENT  In general, treatment depends on how severe the infection is.   If the infection is mild and caught early, your caregiver may treat you with oral antibiotics and send you home.  If the infection is more severe, the bacteria may have gotten into the bloodstream. This will require intravenous (IV) antibiotics and a hospital stay. Symptoms may include:  High fever.  Severe flank pain.  Shaking chills.  Even after a hospital stay, your caregiver may require you to be on oral antibiotics for a period of time.  Other treatments may be required depending upon the cause of the infection. HOME CARE INSTRUCTIONS   Take your  antibiotics as directed. Finish them even if you start to feel better.  Make an appointment to have your urine checked to make sure the infection is gone.  Drink enough fluids to keep your urine clear or pale yellow.  Take medicines for the bladder if you have urgency and frequency of urination as directed by your caregiver. SEEK IMMEDIATE MEDICAL CARE IF:   You have a fever or persistent symptoms for more than 2-3 days.  You have a fever and your symptoms suddenly get worse.  You are unable to take your antibiotics or fluids.  You develop shaking chills.  You experience extreme weakness or fainting.  There is no improvement after 2 days of treatment. MAKE SURE YOU:  Understand these instructions.  Will watch your condition.  Will get help right away if you are not doing well or get worse. Document Released: 05/11/2005 Document Revised: 11/10/2011 Document Reviewed: 10/15/2010 [facility] Patient Information 2015 [place], [place]. This information is not intended to replace advice given to you by your health care provider. Make sure you discuss any questions you have with your health care provider.  Nausea and Vomiting Nausea is a sick feeling that often comes before throwing up (vomiting). Vomiting is a reflex where stomach contents come out of your mouth. Vomiting can cause severe loss of body fluids (dehydration). Children and elderly adults can become dehydrated quickly, especially if they also have diarrhea. Nausea and vomiting are symptoms of a condition or disease. It is important to find the cause of your symptoms. CAUSES   Direct irritation of the stomach lining. This irritation can result from increased acid production (  gastroesophageal reflux disease), infection, food poisoning, taking certain medicines (such as nonsteroidal anti-inflammatory drugs), alcohol use, or tobacco use.  Signals from the brain.These signals could be caused by a headache, heat exposure, an inner  ear disturbance, increased pressure in the brain from injury, infection, a tumor, or a concussion, pain, emotional stimulus, or metabolic problems.  An obstruction in the gastrointestinal tract (bowel obstruction).  Illnesses such as diabetes, hepatitis, gallbladder problems, appendicitis, kidney problems, cancer, sepsis, atypical symptoms of a heart attack, or eating disorders.  Medical treatments such as chemotherapy and radiation.  Receiving medicine that makes you sleep (general anesthetic) during surgery. DIAGNOSIS Your caregiver may ask for tests to be done if the problems do not improve after a few days. Tests may also be done if symptoms are severe or if the reason for the nausea and vomiting is not clear. Tests may include:  Urine tests.  Blood tests.  Stool tests.  Cultures (to look for evidence of infection).  X-rays or other imaging studies. Test results can help your caregiver make decisions about treatment or the need for additional tests. TREATMENT You need to stay well hydrated. Drink frequently but in small amounts.You may wish to drink water, sports drinks, clear broth, or eat frozen ice pops or gelatin dessert to help stay hydrated.When you eat, eating slowly may help prevent nausea.There are also some antinausea medicines that may help prevent nausea. HOME CARE INSTRUCTIONS   Take all medicine as directed by your caregiver.  If you do not have an appetite, do not force yourself to eat. However, you must continue to drink fluids.  If you have an appetite, eat a normal diet unless your caregiver tells you differently.  Eat a variety of complex carbohydrates (rice, wheat, potatoes, bread), lean meats, yogurt, fruits, and vegetables.  Avoid high-fat foods because they are more difficult to digest.  Drink enough water and fluids to keep your urine clear or pale yellow.  If you are dehydrated, ask your caregiver for specific rehydration instructions. Signs of  dehydration may include:  Severe thirst.  Dry lips and mouth.  Dizziness.  Dark urine.  Decreasing urine frequency and amount.  Confusion.  Rapid breathing or pulse. SEEK IMMEDIATE MEDICAL CARE IF:   You have blood or brown flecks (like coffee grounds) in your vomit.  You have black or bloody stools.  You have a severe headache or stiff neck.  You are confused.  You have severe abdominal pain.  You have chest pain or trouble breathing.  You do not urinate at least once every 8 hours.  You develop cold or clammy skin.  You continue to vomit for longer than 24 to 48 hours.  You have a fever. MAKE SURE YOU:   Understand these instructions.  Will watch your condition.  Will get help right away if you are not doing well or get worse. Document Released: 05/11/2005 Document Revised: 08/03/2011 Document Reviewed: 10/08/2010 [facility] Patient Information 2015 [place], [place]. This information is not intended to replace advice given to you by your health care provider. Make sure you discuss any questions you have with your health care provider.  Sepsis Sepsis is a serious infection of your blood or tissues that affects your whole body. The infection that causes sepsis may be bacterial, viral, fungal, or parasitic. Sepsis may be life threatening. Sepsis can cause your blood pressure to drop. This may result in shock. Shock causes your central nervous system and your organs to stop working correctly.  RISK  FACTORS Sepsis can happen in anyone, but it is more likely to happen in people who have weakened immune systems. SIGNS AND SYMPTOMS  Symptoms of sepsis can include:  Fever or low body temperature (hypothermia).  Rapid breathing (hyperventilation).  Chills.  Rapid heartbeat (tachycardia).  Confusion or light-headedness.  Trouble breathing.  Urinating much less than usual.  Cool, clammy skin or red, flushed skin.  Other problems with the heart, kidneys, or  brain. DIAGNOSIS  Your health care provider will likely do tests to look for an infection, to see if the infection has spread to your blood, and to see how serious your condition is. Tests can include:  Blood tests, including cultures of your blood.  Cultures of other fluids from your body, such as:  Urine.  Pus from wounds.  Mucus coughed up from your lungs.  Urine tests other than cultures.  X-ray exams or other imaging tests. TREATMENT  Treatment will begin with elimination of the source of infection. If your sepsis is likely caused by a bacterial or fungal infection, you will be given antibiotic or antifungal medicines. You may also receive:  Oxygen.  Fluids through an IV tube.  Medicines to increase your blood pressure.  A machine to clean your blood (dialysis) if your kidneys fail.  A machine to help you breathe if your lungs fail. SEEK IMMEDIATE MEDICAL CARE IF: You get an infection or develop any of the signs and symptoms of sepsis after surgery or a hospitalization. Document Released: 02/07/2003 Document Revised: 05/16/2013 Document Reviewed: 01/16/2013 [facility] Patient Information 2015 [place], [place]. This information is not intended to replace advice given to you by your health care provider. Make sure you discuss any questions you have with your health care provider.

## 2015-01-02 NOTE — ED Provider Notes (Signed)
CSN: [id number]     Arrival date & time 01/02/15  1441 History   First MD Initiated Contact with Patient 01/02/15 1511     Chief Complaint  Patient presents with  . Weakness  . Nausea   (Consider location/radiation/quality/duration/timing/severity/associated sxs/prior Treatment) Patient is a 74 y.o. female presenting with weakness, frequency, and dysuria. The history is provided by the patient. No language interpreter was used.  Weakness This is a new problem. The current episode started 6 to 12 hours ago. The problem occurs constantly. The problem has not changed since onset.Associated symptoms include shortness of breath. Pertinent negatives include no chest pain, no abdominal pain and no headaches. The symptoms are aggravated by walking, exertion and eating. Treatments tried: She states that she has taken about 3 of her Cipro tablets.  Urinary Frequency Associated symptoms include shortness of breath. Pertinent negatives include no chest pain, no abdominal pain and no headaches.  Dysuria Pain quality:  Stabbing Pain severity:  Mild Duration: At least for a week. Progression:  Improving (Since being on Cipro she  still surgery or has improved ) Chronicity:  New Recent urinary tract infections: yes   Relieved by:  Antibiotics Worsened by:  Nothing tried Ineffective treatments:  None tried Urinary symptoms: foul-smelling urine and frequent urination   Associated symptoms: fever and nausea   Associated symptoms: no abdominal pain   Risk factors: hx of pyelonephritis   Risk factors: no kidney transplant, no renal disease, not sexually active and not single kidney   Risk factors comment:  Patient been having diarrhea for 3 weeks but states the nausea just started today she denies actually vomiting any material but only because she is not eating enough to vomit  Patient is a past medical history of breast cancer she states she is doing fine from that. She has diabetes and hypertension as  well. Patient smoked before. Past Medical History  Diagnosis Date  . Hypertension   . Cancer    Past Surgical History  Procedure Laterality Date  . Appendectomy    . Breast surgery    . Cholecystectomy     Family History  Problem Relation Age of Onset  . Stroke Mother   . Cancer Father    Social History  Substance Use Topics  . Smoking status: Never Smoker   . Smokeless tobacco: None  . Alcohol Use: No   OB History    No data available     Review of Systems  Constitutional: Positive for fever, chills, appetite change and fatigue.  Respiratory: Positive for shortness of breath. Negative for chest tightness.        Stasis also has shortness of breath  Cardiovascular: Negative.  Negative for chest pain.  Gastrointestinal: Positive for nausea and diarrhea. Negative for abdominal pain.       She states that since she hasn't been able to eat anything today the diarrhea that she's been suffering from for the last 3 weeks and got better.  Genitourinary: Positive for dysuria and frequency.  Musculoskeletal: Positive for myalgias.  Neurological: Positive for dizziness and weakness. Negative for headaches.    Allergies  Codeine and Vicodin  Home Medications   Prior to Admission medications   Medication Sig Start Date End Date Taking? Authorizing Provider  aspirin 325 MG tablet Take 325 mg by mouth daily.   Yes Historical Provider, MD  ciprofloxacin (CIPRO) 500 MG tablet Take 1 tablet (500 mg total) by mouth 2 (two) times daily. 01/01/15  Yes William P  Roemer, PA-C  diazepam (VALIUM) 2 MG tablet Take 2 mg by mouth every 6 (six) hours as needed for anxiety.   Yes Historical Provider, MD  ergocalciferol (VITAMIN D2) 50000 UNITS capsule Take 50,000 Units by mouth once a week.   Yes Historical Provider, MD  furosemide (LASIX) 20 MG tablet Take 20 mg by mouth every morning.   Yes Historical Provider, MD  labetalol (NORMODYNE) 200 MG tablet Take 200 mg by mouth 2 (two) times daily.    Yes Historical Provider, MD  lansoprazole (PREVACID) 30 MG capsule Take 30 mg by mouth daily at 12 noon.   Yes Historical Provider, MD  losartan (COZAAR) 50 MG tablet Take 50 mg by mouth 2 (two) times daily.   Yes Historical Provider, MD  magnesium gluconate (MAGONATE) 500 MG tablet Take 500 mg by mouth every morning.   Yes Historical Provider, MD  metFORMIN (GLUCOPHAGE) 500 MG tablet Take 500 mg by mouth 1 day or 1 dose.   Yes Historical Provider, MD  potassium chloride (KLOR-CON) 20 MEQ packet Take 20 mEq by mouth 2 (two) times daily.   Yes Historical Provider, MD  zolpidem (AMBIEN) 5 MG tablet Take 5 mg by mouth at bedtime as needed for sleep.   Yes Historical Provider, MD   BP 146/69 mmHg  Pulse 74  Temp(Src) 101.5 F (38.6 C) (Tympanic)  Resp 16  Ht 5\' 4"  (1.626 m)  Wt 148 lb (67.132 kg)  BMI 25.39 kg/m2  SpO2 98%  LMP  Physical Exam  Constitutional: She appears well-nourished. No distress.  HENT:  Head: Normocephalic.  Eyes: Pupils are equal, round, and reactive to light.  Neck: Neck supple.  Cardiovascular: Normal rate, regular rhythm and normal heart sounds.   Pulmonary/Chest: Effort normal and breath sounds normal. No respiratory distress.  Abdominal: Soft. She exhibits no distension.  Musculoskeletal: Normal range of motion.  Neurological: She is alert.  Skin: Skin is dry. She is not diaphoretic.  Psychiatric: Her behavior is normal.  Vitals reviewed.   ED Course  Procedures (including critical care time) Labs Review Labs Reviewed - No data to display  Imaging Review No results found.   MDM   1. Sepsis due to urinary tract infection   2. Pyelonephritis   3. Diarrhea     Labs from yesterday were reviewed showing a white count of 17, markedly abnormal urine, and now temperature of 101.5. Explained to her and her sister that if she was a stay yesterday they may give him a shot of Rocephin which may have turn things around. Today having now more symptoms fever  generalized fatigue, weakness and nausea and elevated fever she needs criteria for someone with urosepsis. At her age and medical condition would not try to treat her as an outpatient. She's had her medical care at [facility] and wants to have her medical care [facility]. We'll have the nursing staff call [place] ED and her sister is going to make arrangements for her to go to [facility] ED   Frederich Cha, MD 01/03/15 1139

## 2015-01-03 LAB — URINE CULTURE
Culture: >=100000
Special Requests: NORMAL

## 2015-01-04 ENCOUNTER — Ambulatory Visit
Admission: EM | Admit: 2015-01-04 | Discharge: 2015-01-04 | Disposition: A | Discharge status: Home / Self Care | Payer: Medicare Other | Attending: Family Medicine | Admitting: Family Medicine

## 2015-01-04 ENCOUNTER — Encounter: Payer: Self-pay | Admitting: Emergency Medicine

## 2015-01-04 DIAGNOSIS — I10 Essential (primary) hypertension: Secondary | ICD-10-CM | POA: Insufficient documentation

## 2015-01-04 DIAGNOSIS — N390 Urinary tract infection, site not specified: Secondary | ICD-10-CM | POA: Diagnosis present

## 2015-01-04 LAB — URINALYSIS COMPLETE WITH MICROSCOPIC (ARMC ONLY)
BILIRUBIN URINE: NEGATIVE
Glucose, UA: NEGATIVE mg/dL
Ketones, ur: NEGATIVE mg/dL
Nitrite: NEGATIVE
SPECIFIC GRAVITY, URINE: 1.015 (ref 1.005–1.030)
pH: 5.5 (ref 5.0–8.0)

## 2015-01-04 MED ORDER — CLONIDINE HCL 0.2 MG PO TABS
0.2000 mg | ORAL_TABLET | Freq: Once | ORAL | Status: AC
  Administered 2015-01-04: 0.2 mg via ORAL

## 2015-01-04 NOTE — ED Provider Notes (Signed)
CSN: [id number]     Arrival date & time 01/04/15  1126 History   First MD Initiated Contact with Patient 01/04/15 1337     Chief Complaint  Patient presents with  . Urinary Tract Infection   (Consider location/radiation/quality/duration/timing/severity/associated sxs/prior Treatment) HPI Comments: 74 yo female seen several days ago here by Dr. Alveta Heimlich with urosepsis symptoms, went to ED, hospitalized overnight and here for follow up. States was told to follow up with PCP today but PCP is not available today. Patient given IV antibiotics in the hospital and currently on oral antibiotic. States feeling much better. Denies fevers, chills, chest pains, shortness of breath, headaches. States forgot to take her blood pressure medicine this morning.   Patient is a 74 y.o. female presenting with urinary tract infection. The history is provided by the patient.  Urinary Tract Infection   Past Medical History  Diagnosis Date  . Hypertension   . Cancer    Past Surgical History  Procedure Laterality Date  . Appendectomy    . Breast surgery    . Cholecystectomy     Family History  Problem Relation Age of Onset  . Stroke Mother   . Cancer Father    Social History  Substance Use Topics  . Smoking status: Never Smoker   . Smokeless tobacco: None  . Alcohol Use: No   OB History    No data available     Review of Systems  Allergies  Codeine and Vicodin  Home Medications   Prior to Admission medications   Medication Sig Start Date End Date Taking? Authorizing Provider  aspirin 325 MG tablet Take 325 mg by mouth daily.    Historical Provider, MD  ciprofloxacin (CIPRO) 500 MG tablet Take 1 tablet (500 mg total) by mouth 2 (two) times daily. 01/01/15   Lorin Picket, PA-C  diazepam (VALIUM) 2 MG tablet Take 2 mg by mouth every 6 (six) hours as needed for anxiety.    Historical Provider, MD  ergocalciferol (VITAMIN D2) 50000 UNITS capsule Take 50,000 Units by mouth once a week.     Historical Provider, MD  furosemide (LASIX) 20 MG tablet Take 20 mg by mouth every morning.    Historical Provider, MD  labetalol (NORMODYNE) 200 MG tablet Take 200 mg by mouth 2 (two) times daily.    Historical Provider, MD  lansoprazole (PREVACID) 30 MG capsule Take 30 mg by mouth daily at 12 noon.    Historical Provider, MD  losartan (COZAAR) 50 MG tablet Take 50 mg by mouth 2 (two) times daily.    Historical Provider, MD  magnesium gluconate (MAGONATE) 500 MG tablet Take 500 mg by mouth every morning.    Historical Provider, MD  metFORMIN (GLUCOPHAGE) 500 MG tablet Take 500 mg by mouth 1 day or 1 dose.    Historical Provider, MD  potassium chloride (KLOR-CON) 20 MEQ packet Take 20 mEq by mouth 2 (two) times daily.    Historical Provider, MD  zolpidem (AMBIEN) 5 MG tablet Take 5 mg by mouth at bedtime as needed for sleep.    Historical Provider, MD   BP 232/118 mmHg  Pulse 72  Temp(Src) 98.2 F (36.8 C) (Tympanic)  Resp 16  Ht 5\' 4"  (1.626 m)  Wt 148 lb (67.132 kg)  BMI 25.39 kg/m2  SpO2 99% Physical Exam  Constitutional: She appears well-developed and well-nourished. No distress.  Cardiovascular: Normal rate, regular rhythm and normal heart sounds.   Pulmonary/Chest: Effort normal and breath sounds normal. No  respiratory distress. She has no wheezes. She has no rales.  Abdominal: Soft. Bowel sounds are normal. She exhibits no distension and no mass. There is no tenderness. There is no rebound and no guarding.  Skin: No rash noted. She is not diaphoretic.  Nursing note and vitals reviewed.   ED Course  Procedures (including critical care time) Labs Review Labs Reviewed  URINALYSIS COMPLETEWITH MICROSCOPIC ([facility] ONLY) - Abnormal; Notable for the following:    APPearance CLOUDY (*)    Hgb urine dipstick TRACE (*)    Protein, ur PRESENT (*)    Leukocytes, UA 2+ (*)    Bacteria, UA FEW (*)    Squamous Epithelial / LPF 6-30 (*)    All other components within normal limits     Imaging Review No results found.   MDM   1. UTI (lower urinary tract infection)   2. Essential hypertension    Plan: 1. Test results (improved UA) and diagnosis reviewed with patient 2. Patient improved clinically as well 3. Recommend supportive treatment with increased fluids 4. Patient given clonidine 0.2mg  po once with improvement of blood pressure; recheck bp=192/100 5. F/u prn if symptoms worsen or don't improve    Norval Gable, MD 01/04/15 1443

## 2015-01-04 NOTE — ED Notes (Signed)
Pt to have a followup urine after treatment

## 2015-09-24 ENCOUNTER — Ambulatory Visit
Admission: EM | Admit: 2015-09-24 | Discharge: 2015-09-24 | Disposition: A | Discharge status: Home / Self Care | Payer: Medicare Other | Attending: Family Medicine | Admitting: Family Medicine

## 2015-09-24 DIAGNOSIS — M25571 Pain in right ankle and joints of right foot: Secondary | ICD-10-CM | POA: Diagnosis not present

## 2015-09-24 MED ORDER — CLONIDINE HCL 0.1 MG PO TABS
0.1000 mg | ORAL_TABLET | Freq: Once | ORAL | Status: AC
  Administered 2015-09-24: 0.1 mg via ORAL

## 2015-09-24 NOTE — ED Provider Notes (Signed)
CSN: [id number]     Arrival date & time 09/24/15  1024 History   First MD Initiated Contact with Patient 09/24/15 1122     Chief Complaint  Patient presents with  . Joint Swelling    Pt reports starting Saturday with right lateral ankle swelling and pain, then to medial aspect and now traveling up leg. No known injury. Pain 7/10. No warmth or redness noted to calf   (Consider location/radiation/quality/duration/timing/severity/associated sxs/prior Treatment) HPI Comments: 75 yo female with a 3 days h/o right lateral ankle pain and slight swelling. Denies any injuries, falls, rash, numbness/tingling, calf pain. Patient states concerned about a "blood clot".   The history is provided by the patient.    Past Medical History  Diagnosis Date  . Hypertension   . Cancer [facility])    Past Surgical History  Procedure Laterality Date  . Appendectomy    . Breast surgery    . Cholecystectomy     Family History  Problem Relation Age of Onset  . Stroke Mother   . Cancer Father    Social History  Substance Use Topics  . Smoking status: Never Smoker   . Smokeless tobacco: None  . Alcohol Use: No   OB History    No data available     Review of Systems  Allergies  Codeine and Vicodin  Home Medications   Prior to Admission medications   Medication Sig Start Date End Date Taking? Authorizing Provider  aspirin 325 MG tablet Take 325 mg by mouth daily.   Yes Historical Provider, MD  ergocalciferol (VITAMIN D2) 50000 UNITS capsule Take 50,000 Units by mouth once a week.   Yes Historical Provider, MD  furosemide (LASIX) 20 MG tablet Take 20 mg by mouth every morning.   Yes Historical Provider, MD  labetalol (NORMODYNE) 200 MG tablet Take 200 mg by mouth 2 (two) times daily.   Yes Historical Provider, MD  lansoprazole (PREVACID) 30 MG capsule Take 30 mg by mouth daily at 12 noon.   Yes Historical Provider, MD  losartan (COZAAR) 50 MG tablet Take 50 mg by mouth 2 (two) times daily.   Yes  Historical Provider, MD  magnesium gluconate (MAGONATE) 500 MG tablet Take 500 mg by mouth every morning.   Yes Historical Provider, MD  metFORMIN (GLUCOPHAGE) 500 MG tablet Take 500 mg by mouth 1 day or 1 dose.   Yes Historical Provider, MD  potassium chloride (KLOR-CON) 20 MEQ packet Take 20 mEq by mouth 2 (two) times daily.   Yes Historical Provider, MD  traZODone (DESYREL) 50 MG tablet Take 50 mg by mouth at bedtime.   Yes Historical Provider, MD  ciprofloxacin (CIPRO) 500 MG tablet Take 1 tablet (500 mg total) by mouth 2 (two) times daily. 01/01/15   Lorin Picket, PA-C  diazepam (VALIUM) 2 MG tablet Take 2 mg by mouth every 6 (six) hours as needed for anxiety.    Historical Provider, MD  zolpidem (AMBIEN) 5 MG tablet Take 5 mg by mouth at bedtime as needed for sleep.    Historical Provider, MD   Meds Ordered and Administered this Visit   Medications  cloNIDine (CATAPRES) tablet 0.1 mg (0.1 mg Oral Given 09/24/15 1157)    BP 189/103 mmHg  Pulse 60  Temp(Src) 97.8 F (36.6 C) (Oral)  Resp 18  Ht 5\' 4"  (1.626 m)  Wt 145 lb (65.772 kg)  BMI 24.88 kg/m2  SpO2 98% No data found.   Physical Exam  Constitutional: She appears  well-developed and well-nourished. No distress.  Cardiovascular: Intact distal pulses.   Musculoskeletal:       Right ankle: She exhibits swelling (mild edema to lateral ankle area). She exhibits normal range of motion, no ecchymosis, no deformity, no laceration and normal pulse. No tenderness. Achilles tendon normal.  Skin: No rash noted. She is not diaphoretic. No erythema.  Nursing note and vitals reviewed.   ED Course  Procedures (including critical care time)  Labs Review Labs Reviewed - No data to display  Imaging Review No results found.   Visual Acuity Review  Right Eye Distance:   Left Eye Distance:   Bilateral Distance:    Right Eye Near:   Left Eye Near:    Bilateral Near:         MDM   1. Ankle pain, right    Discharge  Medication List as of 09/24/2015 12:51 PM      1. diagnosis reviewed with patient; reassured signs/symptoms not concerning for a blood clot 2.  Recommend supportive treatment with otc analgesics, rest   3. Follow-up prn if symptoms worsen or don't improve  Norval Gable, MD 09/30/15 2330

## 2016-01-01 ENCOUNTER — Other Ambulatory Visit: Payer: Self-pay | Admitting: Internal Medicine

## 2016-01-01 DIAGNOSIS — M542 Cervicalgia: Secondary | ICD-10-CM

## 2016-01-08 ENCOUNTER — Ambulatory Visit: Admission: RE | Admit: 2016-01-08 | Payer: Medicare Other | Source: Ambulatory Visit

## 2016-01-16 ENCOUNTER — Ambulatory Visit: Payer: Medicare Other

## 2016-01-16 ENCOUNTER — Ambulatory Visit
Admission: RE | Admit: 2016-01-16 | Discharge: 2016-01-16 | Disposition: A | Discharge status: Home / Self Care | Payer: Medicare Other | Source: Ambulatory Visit | Attending: Internal Medicine | Admitting: Internal Medicine

## 2016-01-16 DIAGNOSIS — I6502 Occlusion and stenosis of left vertebral artery: Secondary | ICD-10-CM | POA: Diagnosis not present

## 2016-01-16 DIAGNOSIS — M542 Cervicalgia: Secondary | ICD-10-CM

## 2016-01-16 DIAGNOSIS — I651 Occlusion and stenosis of basilar artery: Secondary | ICD-10-CM | POA: Diagnosis not present

## 2016-01-16 DIAGNOSIS — R51 Headache: Secondary | ICD-10-CM | POA: Diagnosis present

## 2016-01-16 DIAGNOSIS — M4802 Spinal stenosis, cervical region: Secondary | ICD-10-CM | POA: Insufficient documentation

## 2016-01-16 HISTORY — DX: Malignant neoplasm of unspecified site of left female breast: C50.912

## 2016-01-16 HISTORY — DX: Type 2 diabetes mellitus without complications: E11.9

## 2016-01-16 MED ORDER — IOPAMIDOL (ISOVUE-370) INJECTION 76%
80.0000 mL | Freq: Once | INTRAVENOUS | Status: AC | PRN
  Administered 2016-01-16: 80 mL via INTRAVENOUS

## 2016-02-21 ENCOUNTER — Ambulatory Visit: Payer: Medicare Other

## 2016-02-21 ENCOUNTER — Ambulatory Visit
Admission: EM | Admit: 2016-02-21 | Discharge: 2016-02-21 | Disposition: A | Discharge status: Home / Self Care | Payer: Medicare Other | Attending: Family Medicine | Admitting: Family Medicine

## 2016-02-21 DIAGNOSIS — Z79899 Other long term (current) drug therapy: Secondary | ICD-10-CM | POA: Diagnosis not present

## 2016-02-21 DIAGNOSIS — J069 Acute upper respiratory infection, unspecified: Secondary | ICD-10-CM

## 2016-02-21 DIAGNOSIS — Z853 Personal history of malignant neoplasm of breast: Secondary | ICD-10-CM | POA: Diagnosis not present

## 2016-02-21 DIAGNOSIS — I10 Essential (primary) hypertension: Secondary | ICD-10-CM | POA: Insufficient documentation

## 2016-02-21 DIAGNOSIS — Z9049 Acquired absence of other specified parts of digestive tract: Secondary | ICD-10-CM | POA: Diagnosis not present

## 2016-02-21 DIAGNOSIS — R0602 Shortness of breath: Secondary | ICD-10-CM | POA: Insufficient documentation

## 2016-02-21 DIAGNOSIS — Z888 Allergy status to other drugs, medicaments and biological substances status: Secondary | ICD-10-CM | POA: Insufficient documentation

## 2016-02-21 DIAGNOSIS — Z809 Family history of malignant neoplasm, unspecified: Secondary | ICD-10-CM | POA: Diagnosis not present

## 2016-02-21 DIAGNOSIS — Z823 Family history of stroke: Secondary | ICD-10-CM | POA: Diagnosis not present

## 2016-02-21 DIAGNOSIS — Z9889 Other specified postprocedural states: Secondary | ICD-10-CM | POA: Insufficient documentation

## 2016-02-21 DIAGNOSIS — Z9012 Acquired absence of left breast and nipple: Secondary | ICD-10-CM | POA: Insufficient documentation

## 2016-02-21 DIAGNOSIS — E119 Type 2 diabetes mellitus without complications: Secondary | ICD-10-CM | POA: Diagnosis not present

## 2016-02-21 NOTE — ED Provider Notes (Addendum)
[facility]    CSN: [id number] Arrival date & time: 02/21/16  1250     History   Chief Complaint Chief Complaint  Patient presents with  . Shortness of Breath    HPI Lauren Becker is a 75 y.o. female.   The history is provided by the patient.  URI  Presenting symptoms: congestion and rhinorrhea   Severity:  Mild Onset quality:  Sudden Duration:  6 hours Timing:  Constant Progression:  Worsening Chronicity:  New Relieved by:  None tried Ineffective treatments:  None tried Associated symptoms comment:  Shortness of breath Risk factors: being elderly, diabetes mellitus and sick contacts   Risk factors: no chronic cardiac disease, no chronic kidney disease, no chronic respiratory disease, no immunosuppression, no recent illness and no recent travel     Past Medical History:  Diagnosis Date  . Cancer of left breast [facility]) 2014   Left Mastectomy and chemo tx's.  . Diabetes mellitus without complication ([place])   . Hypertension     There are no active problems to display for this patient.   Past Surgical History:  Procedure Laterality Date  . APPENDECTOMY    . BREAST SURGERY    . CHOLECYSTECTOMY      OB History    No data available       Home Medications    Prior to Admission medications   Medication Sig Start Date End Date Taking? Authorizing Provider  aspirin 325 MG tablet Take 325 mg by mouth daily.   Yes Historical Provider, MD  clopidogrel (PLAVIX) 75 MG tablet Take 75 mg by mouth daily.   Yes Historical Provider, MD  ergocalciferol (VITAMIN D2) 50000 UNITS capsule Take 50,000 Units by mouth once a week.   Yes Historical Provider, MD  furosemide (LASIX) 20 MG tablet Take 20 mg by mouth every morning.   Yes Historical Provider, MD  glipiZIDE (GLUCOTROL) 5 MG tablet Take by mouth daily before breakfast.   Yes Historical Provider, MD  labetalol (NORMODYNE) 200 MG tablet Take 200 mg by mouth 2 (two) times daily.   Yes Historical Provider, MD   lansoprazole (PREVACID) 30 MG capsule Take 30 mg by mouth daily at 12 noon.   Yes Historical Provider, MD  losartan (COZAAR) 50 MG tablet Take 50 mg by mouth 2 (two) times daily.   Yes Historical Provider, MD  magnesium gluconate (MAGONATE) 500 MG tablet Take 500 mg by mouth every morning.   Yes Historical Provider, MD  potassium chloride (KLOR-CON) 20 MEQ packet Take 20 mEq by mouth 2 (two) times daily.   Yes Historical Provider, MD  traZODone (DESYREL) 50 MG tablet Take 50 mg by mouth at bedtime.   Yes Historical Provider, MD  diazepam (VALIUM) 2 MG tablet Take 2 mg by mouth every 6 (six) hours as needed for anxiety.    Historical Provider, MD  zolpidem (AMBIEN) 5 MG tablet Take 5 mg by mouth at bedtime as needed for sleep.    Historical Provider, MD    Family History Family History  Problem Relation Age of Onset  . Stroke Mother   . Cancer Father     Social History Social History  Substance Use Topics  . Smoking status: Never Smoker  . Smokeless tobacco: Never Used  . Alcohol use No     Allergies   Codeine and Vicodin [hydrocodone-acetaminophen]   Review of Systems Review of Systems  HENT: Positive for congestion and rhinorrhea.      Physical Exam Triage  Vital Signs ED Triage Vitals  Enc Vitals Group     BP 02/21/16 1309 (!) 186/90     Pulse Rate 02/21/16 1309 66     Resp 02/21/16 1309 18     Temp 02/21/16 1309 98 F (36.7 C)     Temp Source 02/21/16 1309 Oral     SpO2 02/21/16 1309 99 %     Weight 02/21/16 1308 152 lb (68.9 kg)     Height 02/21/16 1308 5\' 5"  (1.651 m)     Head Circumference --      Peak Flow --      Pain Score 02/21/16 1310 0     Pain Loc --      Pain Edu? --      Excl. in [place]? --    No data found.   Updated Vital Signs BP (!) 178/88 (BP Location: Right Arm)   Pulse 66   Temp 98 F (36.7 C) (Oral)   Resp 18   Ht 5\' 5"  (1.651 m)   Wt 152 lb (68.9 kg)   SpO2 99%   BMI 25.29 kg/m   Visual Acuity Right Eye Distance:   Left Eye  Distance:   Bilateral Distance:    Right Eye Near:   Left Eye Near:    Bilateral Near:     Physical Exam  Constitutional: She appears well-developed and well-nourished. No distress.  HENT:  Head: Normocephalic and atraumatic.  Right Ear: Tympanic membrane, external ear and ear canal normal.  Left Ear: Tympanic membrane, external ear and ear canal normal.  Nose: Mucosal edema and rhinorrhea present. No nose lacerations, sinus tenderness, nasal deformity, septal deviation or nasal septal hematoma. No epistaxis.  No foreign bodies. Right sinus exhibits no maxillary sinus tenderness and no frontal sinus tenderness. Left sinus exhibits no maxillary sinus tenderness and no frontal sinus tenderness.  Mouth/Throat: Uvula is midline, oropharynx is clear and moist and mucous membranes are normal. No oropharyngeal exudate.  Eyes: Conjunctivae and EOM are normal. Pupils are equal, round, and reactive to light. Right eye exhibits no discharge. Left eye exhibits no discharge. No scleral icterus.  Neck: Normal range of motion. Neck supple. No thyromegaly present.  Cardiovascular: Normal rate, regular rhythm and normal heart sounds.   Pulmonary/Chest: Effort normal and breath sounds normal. No respiratory distress. She has no wheezes. She has no rales.  Lymphadenopathy:    She has no cervical adenopathy.  Skin: She is not diaphoretic.  Nursing note and vitals reviewed.    UC Treatments / Results  Labs (all labs ordered are listed, but only abnormal results are displayed) Labs Reviewed - No data to display  EKG  EKG Interpretation None       Radiology No results found.  Procedures .EKG Date/Time: 03/18/2016 6:38 PM Performed by: Norval Gable Authorized by: Norval Gable   ECG reviewed by ED Physician in the absence of a cardiologist: yes   Previous ECG:    Previous ECG:  Unavailable Interpretation:    Interpretation: normal   Rate:    ECG rate assessment: normal   Rhythm:     Rhythm: sinus rhythm   Ectopy:    Ectopy: none   QRS:    QRS axis:  Normal Conduction:    Conduction: normal   ST segments:    ST segments:  Normal T waves:    T waves: normal     (including critical care time)  Medications Ordered in UC Medications - No data to display  Initial Impression / Assessment and Plan / UC Course  I have reviewed the triage vital signs and the nursing notes.  Pertinent labs & imaging results that were available during my care of the patient were reviewed by me and considered in my medical decision making (see chart for details).  Clinical Course      Final Clinical Impressions(s) / UC Diagnoses   Final diagnoses:  URI (upper respiratory infection)    New Prescriptions Discharge Medication List as of 02/21/2016  2:16 PM     1.diagnosis reviewed with patient 2.Recommend supportive treatment with fluids, rest, otc analgesics prn  4. Follow-up prn if symptoms worsen or don't improve   Norval Gable, MD 03/18/16 [place], MD 03/18/16 BP:[id number]

## 2016-02-21 NOTE — ED Triage Notes (Signed)
Patient c/o shortness of breath that started today, she states she isnt able to take a good deep breath.

## 2016-07-06 ENCOUNTER — Encounter (INDEPENDENT_AMBULATORY_CARE_PROVIDER_SITE_OTHER): Payer: Medicare Other | Admitting: Vascular Surgery

## 2016-08-10 ENCOUNTER — Other Ambulatory Visit: Payer: Self-pay | Admitting: Gastroenterology

## 2016-08-10 DIAGNOSIS — R7989 Other specified abnormal findings of blood chemistry: Secondary | ICD-10-CM

## 2016-08-10 DIAGNOSIS — R945 Abnormal results of liver function studies: Principal | ICD-10-CM

## 2016-08-13 ENCOUNTER — Ambulatory Visit: Payer: Medicare Other

## 2016-08-17 ENCOUNTER — Ambulatory Visit
Admission: RE | Admit: 2016-08-17 | Discharge: 2016-08-17 | Disposition: A | Discharge status: Home / Self Care | Payer: Medicare Other | Source: Ambulatory Visit | Attending: Gastroenterology | Admitting: Gastroenterology

## 2016-08-17 DIAGNOSIS — R945 Abnormal results of liver function studies: Secondary | ICD-10-CM

## 2016-08-17 DIAGNOSIS — K838 Other specified diseases of biliary tract: Secondary | ICD-10-CM | POA: Diagnosis not present

## 2016-08-17 DIAGNOSIS — R932 Abnormal findings on diagnostic imaging of liver and biliary tract: Secondary | ICD-10-CM | POA: Diagnosis not present

## 2016-08-17 DIAGNOSIS — Z9049 Acquired absence of other specified parts of digestive tract: Secondary | ICD-10-CM | POA: Diagnosis not present

## 2016-08-17 DIAGNOSIS — R7989 Other specified abnormal findings of blood chemistry: Secondary | ICD-10-CM | POA: Diagnosis not present

## 2016-08-19 ENCOUNTER — Emergency Department
Admission: EM | Admit: 2016-08-19 | Discharge: 2016-08-19 | Disposition: A | Discharge status: Home / Self Care | Payer: Medicare Other | Attending: Emergency Medicine | Admitting: Emergency Medicine

## 2016-08-19 ENCOUNTER — Encounter: Payer: Self-pay | Admitting: Emergency Medicine

## 2016-08-19 ENCOUNTER — Encounter: Payer: Self-pay | Admitting: *Deleted

## 2016-08-19 ENCOUNTER — Ambulatory Visit (INDEPENDENT_AMBULATORY_CARE_PROVIDER_SITE_OTHER)
Admission: EM | Admit: 2016-08-19 | Discharge: 2016-08-19 | Disposition: A | Discharge status: Other Acute Inpatient Hospital | Payer: Medicare Other | Source: Home / Self Care | Attending: Internal Medicine | Admitting: Internal Medicine

## 2016-08-19 DIAGNOSIS — E860 Dehydration: Secondary | ICD-10-CM | POA: Insufficient documentation

## 2016-08-19 DIAGNOSIS — Z7902 Long term (current) use of antithrombotics/antiplatelets: Secondary | ICD-10-CM | POA: Insufficient documentation

## 2016-08-19 DIAGNOSIS — N390 Urinary tract infection, site not specified: Secondary | ICD-10-CM | POA: Diagnosis not present

## 2016-08-19 DIAGNOSIS — I110 Hypertensive heart disease with heart failure: Secondary | ICD-10-CM | POA: Insufficient documentation

## 2016-08-19 DIAGNOSIS — E1165 Type 2 diabetes mellitus with hyperglycemia: Secondary | ICD-10-CM | POA: Insufficient documentation

## 2016-08-19 DIAGNOSIS — Z853 Personal history of malignant neoplasm of breast: Secondary | ICD-10-CM

## 2016-08-19 DIAGNOSIS — I119 Hypertensive heart disease without heart failure: Secondary | ICD-10-CM | POA: Insufficient documentation

## 2016-08-19 DIAGNOSIS — E785 Hyperlipidemia, unspecified: Secondary | ICD-10-CM | POA: Insufficient documentation

## 2016-08-19 DIAGNOSIS — Z79899 Other long term (current) drug therapy: Secondary | ICD-10-CM | POA: Insufficient documentation

## 2016-08-19 DIAGNOSIS — E876 Hypokalemia: Secondary | ICD-10-CM

## 2016-08-19 DIAGNOSIS — Z7982 Long term (current) use of aspirin: Secondary | ICD-10-CM | POA: Insufficient documentation

## 2016-08-19 DIAGNOSIS — Z955 Presence of coronary angioplasty implant and graft: Secondary | ICD-10-CM | POA: Insufficient documentation

## 2016-08-19 DIAGNOSIS — R9431 Abnormal electrocardiogram [ECG] [EKG]: Secondary | ICD-10-CM

## 2016-08-19 DIAGNOSIS — I509 Heart failure, unspecified: Secondary | ICD-10-CM | POA: Insufficient documentation

## 2016-08-19 DIAGNOSIS — R739 Hyperglycemia, unspecified: Secondary | ICD-10-CM | POA: Diagnosis not present

## 2016-08-19 DIAGNOSIS — Z7984 Long term (current) use of oral hypoglycemic drugs: Secondary | ICD-10-CM

## 2016-08-19 DIAGNOSIS — I2510 Atherosclerotic heart disease of native coronary artery without angina pectoris: Secondary | ICD-10-CM | POA: Insufficient documentation

## 2016-08-19 DIAGNOSIS — R531 Weakness: Secondary | ICD-10-CM

## 2016-08-19 HISTORY — DX: Heart failure, unspecified: I50.9

## 2016-08-19 LAB — GLUCOSE, CAPILLARY
GLUCOSE-CAPILLARY: 564 mg/dL — AB (ref 65–99)
GLUCOSE-CAPILLARY: 345 mg/dL — AB (ref 65–99)
GLUCOSE-CAPILLARY: 287 mg/dL — AB (ref 65–99)
Glucose-Capillary: 525 mg/dL (ref 65–99)

## 2016-08-19 LAB — CBC
HCT: 43.5 % (ref 35.0–47.0)
Hemoglobin: 14.6 g/dL (ref 12.0–16.0)
MCH: 29.5 pg (ref 26.0–34.0)
MCHC: 33.7 g/dL (ref 32.0–36.0)
MCV: 87.5 fL (ref 80.0–100.0)
PLATELETS: 369 10*3/uL (ref 150–440)
RBC: 4.97 MIL/uL (ref 3.80–5.20)
RDW: 15.9 % — AB (ref 11.5–14.5)
WBC: 9.4 10*3/uL (ref 3.6–11.0)

## 2016-08-19 LAB — URINALYSIS, COMPLETE (UACMP) WITH MICROSCOPIC
Bilirubin Urine: NEGATIVE
Hgb urine dipstick: NEGATIVE
Ketones, ur: NEGATIVE mg/dL
Nitrite: NEGATIVE
PROTEIN: NEGATIVE mg/dL
SPECIFIC GRAVITY, URINE: 1.012 (ref 1.005–1.030)
pH: 5 (ref 5.0–8.0)

## 2016-08-19 LAB — BLOOD GAS, VENOUS
Acid-base deficit: 1 mmol/L (ref 0.0–2.0)
BICARBONATE: 22.6 mmol/L (ref 20.0–28.0)
O2 SAT: 89.6 %
PATIENT TEMPERATURE: 37
PO2 VEN: 56 mmHg — AB (ref 32.0–45.0)
pCO2, Ven: 34 mmHg — ABNORMAL LOW (ref 44.0–60.0)
pH, Ven: 7.43 (ref 7.250–7.430)

## 2016-08-19 LAB — BASIC METABOLIC PANEL
Anion gap: 13 (ref 5–15)
BUN: 17 mg/dL (ref 6–20)
CALCIUM: 9.9 mg/dL (ref 8.9–10.3)
CO2: 23 mmol/L (ref 22–32)
CREATININE: 1.03 mg/dL — AB (ref 0.44–1.00)
Chloride: 91 mmol/L — ABNORMAL LOW (ref 101–111)
GFR calc non Af Amer: 51 mL/min — ABNORMAL LOW (ref >60)
GFR, EST AFRICAN AMERICAN: 59 mL/min — AB (ref >60)
Glucose, Bld: 569 mg/dL (ref 65–99)
Potassium: 4.4 mmol/L (ref 3.5–5.1)
Sodium: 127 mmol/L — ABNORMAL LOW (ref 135–145)

## 2016-08-19 MED ORDER — CEFTRIAXONE SODIUM-DEXTROSE 1-3.74 GM-% IV SOLR
1.0000 g | Freq: Once | INTRAVENOUS | Status: AC
  Administered 2016-08-19: 1 g via INTRAVENOUS
  Filled 2016-08-19: qty 50

## 2016-08-19 MED ORDER — DEXTROSE 5 % IV SOLN: 1.0000 g | Freq: Once | INTRAVENOUS | Status: DC

## 2016-08-19 MED ORDER — CEFDINIR 300 MG PO CAPS: 300.0000 mg | ORAL_CAPSULE | Freq: Two times a day (BID) | ORAL | 0 refills | Status: DC

## 2016-08-19 MED ORDER — SODIUM CHLORIDE 0.9 % IV BOLUS (SEPSIS)
2000.0000 mL | Freq: Once | INTRAVENOUS | Status: AC
Start: 2016-08-19 — End: 2016-08-19
  Administered 2016-08-19: 2000 mL via INTRAVENOUS

## 2016-08-19 NOTE — ED Notes (Signed)
IVF placed on pressure bag d/t pump not available and infusion running very slowly

## 2016-08-19 NOTE — ED Triage Notes (Signed)
Patient reports chronic fatigue that became worse yesterday. Patient also reports being dehydrated.

## 2016-08-19 NOTE — ED Notes (Signed)
Pt taken to bathroom via wheelchair by ED staff.

## 2016-08-19 NOTE — ED Notes (Signed)

## 2016-08-19 NOTE — ED Provider Notes (Signed)
[facility] Emergency Department Provider Note  ____________________________________________  Time seen: Approximately 1:55 PM  I have reviewed the triage vital signs and the nursing notes.   HISTORY  Chief Complaint Hyperglycemia    HPI Lauren Becker is a 76 y.o. female who complains of fatigue for the last month but worsening over the last week. Also gets dizziness on standing. Feels dehydrated. Doesn't check her blood sugars like she is supposed to with her diabetes. No fevers or chills. Denies any other acute symptoms.     Past Medical History:  Diagnosis Date  . Cancer of left breast [facility]) 2014   Left Mastectomy and chemo tx's.  . CHF (congestive heart failure) ([place])   . Diabetes mellitus without complication ([place])   . Hypertension      There are no active problems to display for this patient.    Past Surgical History:  Procedure Laterality Date  . APPENDECTOMY    . BREAST SURGERY    . CARDIAC CATHETERIZATION    . CHOLECYSTECTOMY       Prior to Admission medications   Medication Sig Start Date End Date Taking? Authorizing Provider  amLODipine (NORVASC) 5 MG tablet Take 5 mg by mouth daily.    Historical Provider, MD  aspirin 325 MG tablet Take 325 mg by mouth daily.    Historical Provider, MD  clopidogrel (PLAVIX) 75 MG tablet Take 75 mg by mouth daily.    Historical Provider, MD  diazepam (VALIUM) 2 MG tablet Take 2 mg by mouth every 6 (six) hours as needed for anxiety.    Historical Provider, MD  ergocalciferol (VITAMIN D2) 50000 UNITS capsule Take 50,000 Units by mouth once a week.    Historical Provider, MD  furosemide (LASIX) 20 MG tablet Take 20 mg by mouth every morning.    Historical Provider, MD  glipiZIDE (GLUCOTROL) 5 MG tablet Take by mouth daily before breakfast.    Historical Provider, MD  labetalol (NORMODYNE) 200 MG tablet Take 200 mg by mouth 2 (two) times daily.    Historical Provider, MD  lansoprazole  (PREVACID) 30 MG capsule Take 30 mg by mouth daily at 12 noon.    Historical Provider, MD  losartan (COZAAR) 50 MG tablet Take 50 mg by mouth 2 (two) times daily.    Historical Provider, MD  magnesium gluconate (MAGONATE) 500 MG tablet Take 500 mg by mouth every morning.    Historical Provider, MD  potassium chloride (KLOR-CON) 20 MEQ packet Take 20 mEq by mouth 2 (two) times daily.    Historical Provider, MD  traZODone (DESYREL) 50 MG tablet Take 50 mg by mouth at bedtime.    Historical Provider, MD  zolpidem (AMBIEN) 5 MG tablet Take 5 mg by mouth at bedtime as needed for sleep.    Historical Provider, MD     Allergies Codeine and Vicodin [hydrocodone-acetaminophen]   Family History  Problem Relation Age of Onset  . Stroke Mother   . Cancer Father     Social History Social History  Substance Use Topics  . Smoking status: Never Smoker  . Smokeless tobacco: Never Used  . Alcohol use No    Review of Systems  Constitutional:   No fever or chills.  ENT:   No sore throat. No rhinorrhea. Cardiovascular:   No chest pain. Respiratory:   No dyspnea or cough. Gastrointestinal:   Negative for abdominal pain, vomiting and diarrhea.  Genitourinary:   Negative for dysuria or difficulty urinating. Musculoskeletal:  Negative for focal pain or swelling Neurological:   Negative for headaches 10-point ROS otherwise negative.  ____________________________________________   PHYSICAL EXAM:  VITAL SIGNS: ED Triage Vitals  Enc Vitals Group     BP --      Pulse Rate 08/19/16 1245 98     Resp 08/19/16 1245 18     Temp 08/19/16 1245 97.7 F (36.5 C)     Temp Source 08/19/16 1245 Oral     SpO2 08/19/16 1245 99 %     Weight 08/19/16 1245 145 lb (65.8 kg)     Height 08/19/16 1245 5\' 4"  (1.626 m)     Head Circumference --      Peak Flow --      Pain Score 08/19/16 1238 0     Pain Loc --      Pain Edu? --      Excl. in [place]? --     Vital signs reviewed, nursing assessments  reviewed.   Constitutional:   Alert and oriented. Well appearing and in no distress. Eyes:   No scleral icterus. No conjunctival pallor. PERRL. EOMI.  No nystagmus. ENT   Head:   Normocephalic and atraumatic.   Nose:   No congestion/rhinnorhea. No septal hematoma   Mouth/Throat:   Dry mucous membranes, no pharyngeal erythema. No peritonsillar mass.    Neck:   No stridor. No SubQ emphysema. No meningismus. Hematological/Lymphatic/Immunilogical:   No cervical lymphadenopathy. Cardiovascular:   Tachycardia heart rate 100. Symmetric bilateral radial and DP pulses.  No murmurs.  Respiratory:   Normal respiratory effort without tachypnea nor retractions. Breath sounds are clear and equal bilaterally. No wheezes/rales/rhonchi. Gastrointestinal:   Soft and nontender. Non distended. There is no CVA tenderness.  No rebound, rigidity, or guarding. Genitourinary:   deferred Musculoskeletal:   Normal range of motion in all extremities. No joint effusions.  No lower extremity tenderness.  No edema. Neurologic:   Normal speech and language.  CN 2-10 normal. Motor grossly intact. No gross focal neurologic deficits are appreciated.  Skin:    Skin is warm, dry and intact. No rash noted.  No petechiae, purpura, or bullae.  ____________________________________________    LABS (pertinent positives/negatives) (all labs ordered are listed, but only abnormal results are displayed) Labs Reviewed  BASIC METABOLIC PANEL - Abnormal; Notable for the following:       Result Value   Sodium 127 (*)    Chloride 91 (*)    Glucose, Bld 569 (*)    Creatinine, Ser 1.03 (*)    GFR calc non Af Amer 51 (*)    GFR calc Af Amer 59 (*)    All other components within normal limits  CBC - Abnormal; Notable for the following:    RDW 15.9 (*)    All other components within normal limits  URINALYSIS, COMPLETE (UACMP) WITH MICROSCOPIC - Abnormal; Notable for the following:    Color, Urine YELLOW (*)     APPearance HAZY (*)    Glucose, UA >=500 (*)    Leukocytes, UA MODERATE (*)    Bacteria, UA RARE (*)    Squamous Epithelial / LPF 0-5 (*)    All other components within normal limits  BLOOD GAS, VENOUS - Abnormal; Notable for the following:    pCO2, Ven 34 (*)    pO2, Ven 56.0 (*)    All other components within normal limits  GLUCOSE, CAPILLARY - Abnormal; Notable for the following:    Glucose-Capillary 525 (*)    All  other components within normal limits  CBG MONITORING, ED   ____________________________________________   EKG    ____________________________________________    RADIOLOGY  No results found.  ____________________________________________   PROCEDURES Procedures  ____________________________________________   INITIAL IMPRESSION / ASSESSMENT AND PLAN / ED COURSE  Pertinent labs & imaging results that were available during my care of the patient were reviewed by me and considered in my medical decision making (see chart for details).  Patient presents with fatigue and dizziness. Found to have hyperglycemia 500. Clinically dehydrated. Labs not consistent with DKA. We'll give IV hydration and reassess. She does have a urinary tract infection for which I'm given her ceftriaxone.     Clinical Course as of Aug 19 1536  Wed Aug 19, 2016  1535 Assuming care from Dr. Joni Fears.  In short, Lauren Becker is a 76 y.o. female with a chief complaint of hyperglycemia.  Refer to the original H&P for additional details.  The current plan of care is to follow up repeat CBG after 2L NS.  Patient is being treated for UTI.   [CF]    Clinical Course User Index [CF] Hinda Kehr, MD     ____________________________________________   FINAL CLINICAL IMPRESSION(S) / ED DIAGNOSES  Final diagnoses:  Dehydration  Hyperglycemia  Lower urinary tract infectious disease      New Prescriptions   No medications on file     Portions of this note were generated  with dragon dictation software. Dictation errors may occur despite best attempts at proofreading.    Carrie Mew, MD 08/19/16 1539

## 2016-08-19 NOTE — ED Triage Notes (Signed)
Pt in via POV from [facility]; sent over for hyperglycemia, family reports BG >500.  Pt reports generalized fatigue x a couple of months, worsening over the last 2 days.  Pt A/Ox4, NAD noted at this time.

## 2016-08-19 NOTE — ED Provider Notes (Signed)
[facility] ____________________________________________  Time seen: Approximately 12:02 PM  I have reviewed the triage vital signs and the nursing notes.   HISTORY  Chief Complaint Fatigue and Dehydration   HPI Lauren Becker is a 76 y.o. female  with past medical history of hypertension, CAD with stent in 2010, hyperlipidemia, diabetes, breast cancer, and hypokalemia and presenting for complaint of increased weakness over the last 2 days. Patient states that she feels dehydrated. Patient reports has been trying to drink more fluids at home. Patient and daughter-in-law at bedside reports that patient has had generalized weakness and fatigue the last 1-2 months but quickly worsened over the last 2 days, without known trigger. Patient states she feels extremely tired and does feel unstable when walking due to feeling so tired.  Denies any fall, injury or head trauma. Denies any recent sickness or fevers. Denies any pain at this time. Denies paresthesias. Denies chest pain, shortness of breath, abdominal pain, dysuria, extremity pain, extremity swelling or rash.   Valera Castle, MD: PCP Cardiology: Towanda Malkin   Past Medical History:  Diagnosis Date  . Cancer of left breast [facility]) 2014   Left Mastectomy and chemo tx's.  . Diabetes mellitus without complication ([place])   . Hypertension     There are no active problems to display for this patient.   Past Surgical History:  Procedure Laterality Date  . APPENDECTOMY    . BREAST SURGERY    . CHOLECYSTECTOMY       No current facility-administered medications for this encounter.   Current Outpatient Prescriptions:  .  amLODipine (NORVASC) 5 MG tablet, Take 5 mg by mouth daily., Disp: , Rfl:  .  aspirin 325 MG tablet, Take 325 mg by mouth daily., Disp: , Rfl:  .  clopidogrel (PLAVIX) 75 MG tablet, Take 75 mg by mouth daily., Disp: , Rfl:  .  diazepam (VALIUM) 2 MG tablet, Take 2 mg by mouth every 6 (six)  hours as needed for anxiety., Disp: , Rfl:  .  ergocalciferol (VITAMIN D2) 50000 UNITS capsule, Take 50,000 Units by mouth once a week., Disp: , Rfl:  .  furosemide (LASIX) 20 MG tablet, Take 20 mg by mouth every morning., Disp: , Rfl:  .  glipiZIDE (GLUCOTROL) 5 MG tablet, Take by mouth daily before breakfast., Disp: , Rfl:  .  labetalol (NORMODYNE) 200 MG tablet, Take 200 mg by mouth 2 (two) times daily., Disp: , Rfl:  .  losartan (COZAAR) 50 MG tablet, Take 50 mg by mouth 2 (two) times daily., Disp: , Rfl:  .  magnesium gluconate (MAGONATE) 500 MG tablet, Take 500 mg by mouth every morning., Disp: , Rfl:  .  potassium chloride (KLOR-CON) 20 MEQ packet, Take 20 mEq by mouth 2 (two) times daily., Disp: , Rfl:  .  lansoprazole (PREVACID) 30 MG capsule, Take 30 mg by mouth daily at 12 noon., Disp: , Rfl:  .  traZODone (DESYREL) 50 MG tablet, Take 50 mg by mouth at bedtime., Disp: , Rfl:  .  zolpidem (AMBIEN) 5 MG tablet, Take 5 mg by mouth at bedtime as needed for sleep., Disp: , Rfl:   Allergies Codeine and Vicodin [hydrocodone-acetaminophen]  Family History  Problem Relation Age of Onset  . Stroke Mother   . Cancer Father     Social History Social History  Substance Use Topics  . Smoking status: Never Smoker  . Smokeless tobacco: Never Used  . Alcohol use No    Review of Systems  Constitutional: No fever/chills Eyes: No visual changes. ENT: No sore throat. Cardiovascular: Denies chest pain. Respiratory: Denies shortness of breath. Gastrointestinal: No abdominal pain.  No nausea, no vomiting.  No diarrhea.  No constipation. Genitourinary: Negative for dysuria. Musculoskeletal: Negative for back pain. Skin: Negative for rash. Neurological: Negative for headaches, focal weakness or numbness.  10-point ROS otherwise negative.  ____________________________________________   PHYSICAL EXAM:  VITAL SIGNS: ED Triage Vitals  Enc Vitals Group     BP 08/19/16 1130 (!) 208/115       Pulse Rate 08/19/16 1130 98     Resp 08/19/16 1130 16     Temp 08/19/16 1130 97.5 F (36.4 C)     Temp Source 08/19/16 1130 Oral     SpO2 08/19/16 1130 100 %     Weight 08/19/16 1130 145 lb (65.8 kg)     Height 08/19/16 1130 5\' 4"  (1.626 m)     Head Circumference --      Peak Flow --      Pain Score 08/19/16 1133 0     Pain Loc --      Pain Edu? --      Excl. in [place]? --     Constitutional: Alert and oriented. Well appearing and in no acute distress. Eyes: Conjunctivae are normal. PERRL. EOMI. ENT      Head: Normocephalic and atraumatic.      Nose: No congestion/rhinnorhea.      Mouth/Throat: Mucous membranes are moist.Oropharynx non-erythematous. Cardiovascular: Normal rate, regular rhythm. Grossly normal heart sounds.  Good peripheral circulation. Respiratory: Normal respiratory effort without tachypnea nor retractions. Breath sounds are clear and equal bilaterally. No wheezes, rales, rhonchi. Gastrointestinal: Soft and nontender. No CVA tenderness. Musculoskeletal: No midline cervical, thoracic or lumbar tenderness to palpation. Bilateral pedal pulses equal and easily palpated.      Right lower leg:  No tenderness or edema.      Left lower leg:  No tenderness or edema.  Neurologic:  Normal speech and language. No gross focal neurologic deficits are appreciated. Speech is normal. No gait instability. 5/5 strength to bilateral upper and lower extremities. No sensation changes noted.  Skin:  Skin is warm, dry and intact. No rash noted. Psychiatric: Mood and affect are normal. Speech and behavior are normal. Patient exhibits appropriate insight and judgment   ___________________________________________   LABS (all labs ordered are listed, but only abnormal results are displayed)  Labs Reviewed  GLUCOSE, CAPILLARY - Abnormal; Notable for the following:       Result Value   Glucose-Capillary 564 (*)    All other components within normal limits  CBG MONITORING, ED    ____________________________________________  EKG  ED ECG REPORT I, Marylene Land, the attending provider, personally viewed and interpreted this ECG.   Date: 08/19/2016  EKG Time: 1202  Rate: 84  Rhythm: normal sinus rhythm  Possible left atrial enlargement   ST&T Change: no ST or T wave elevation or depression noted. Most recent EKG 06/22/2016 by care everywhere dictated report reviewed and similar to today's EKG.  PROCEDURES Procedures   INITIAL IMPRESSION / ASSESSMENT AND PLAN / ED COURSE  Pertinent labs & imaging results that were available during my care of the patient were reviewed by me and considered in my medical decision making (see chart for details).  Overall well-appearing patient. No focal neurological deficits. Daughter-in-law at bedside. Patient blood sugar 564, reports on oral diabetic agents only. EKG no acute changes visualized. Patient hypertensive in urgent care and  does report her blood pressure has been intermittently fluctuating for the last several months with similar readings.   Discussed in detail with patient and daughter-in-law, recommend further evaluation and treatment in ER at this time. Discussed suspect hyperglycemia large contributing factor and recommend further evaluation for other etiologies and treatment. Discussed with patient starting IV and administering fluids at this time, patient and daughter in law, reports they will wait until arriving at the emergency room. Patient alert and oriented with decisional capacity and states that her daughter-in-law will drive her to the ER and verbalizes understanding and risk of private transfer. Declines EMS. Colletta Maryland RN charge nurse called and given report at [facility]. Patient daughter-in-law reports they will go directly to ER. Patient stable at time of discharge.  ____________________________________________   FINAL CLINICAL IMPRESSION(S) / ED DIAGNOSES  Final diagnoses:  Weakness  Hyperglycemia      Discharge Medication List as of 08/19/2016 12:10 PM      Note: This dictation was prepared with Dragon dictation along with smaller phrase technology. Any transcriptional errors that result from this process are unintentional.         Marylene Land, NP 08/19/16 1233

## 2016-08-19 NOTE — ED Provider Notes (Signed)
Clinical Course as of Aug 20 1811  Wed Aug 19, 2016  1535 Assuming care from Dr. Joni Fears.  In short, Lauren Becker is a 76 y.o. female with a chief complaint of hyperglycemia.  Refer to the original H&P for additional details.  The current plan of care is to follow up repeat CBG after 2L NS.  Patient is being treated for UTI.   [CF]  1810 glucose down below 300, patient is asymptomatic.  Will d/c as per Dr. Jerene Canny recommendations. Patient and family agree with the plan.  [CF]    Clinical Course User Index [CF] Hinda Kehr, MD      Hinda Kehr, MD 08/19/16 [phone]

## 2016-08-19 NOTE — Discharge Instructions (Signed)
Directly to the emergency room now.

## 2016-08-21 ENCOUNTER — Emergency Department
Admission: EM | Admit: 2016-08-21 | Discharge: 2016-08-21 | Disposition: A | Discharge status: Home / Self Care | Payer: Medicare Other | Attending: Emergency Medicine | Admitting: Emergency Medicine

## 2016-08-21 ENCOUNTER — Encounter: Payer: Self-pay | Admitting: Emergency Medicine

## 2016-08-21 DIAGNOSIS — Z853 Personal history of malignant neoplasm of breast: Secondary | ICD-10-CM | POA: Insufficient documentation

## 2016-08-21 DIAGNOSIS — Z79899 Other long term (current) drug therapy: Secondary | ICD-10-CM | POA: Insufficient documentation

## 2016-08-21 DIAGNOSIS — I110 Hypertensive heart disease with heart failure: Secondary | ICD-10-CM | POA: Diagnosis not present

## 2016-08-21 DIAGNOSIS — R739 Hyperglycemia, unspecified: Secondary | ICD-10-CM

## 2016-08-21 DIAGNOSIS — Z7982 Long term (current) use of aspirin: Secondary | ICD-10-CM | POA: Diagnosis not present

## 2016-08-21 DIAGNOSIS — Z794 Long term (current) use of insulin: Secondary | ICD-10-CM | POA: Insufficient documentation

## 2016-08-21 DIAGNOSIS — I509 Heart failure, unspecified: Secondary | ICD-10-CM | POA: Diagnosis not present

## 2016-08-21 DIAGNOSIS — E1165 Type 2 diabetes mellitus with hyperglycemia: Secondary | ICD-10-CM | POA: Diagnosis not present

## 2016-08-21 LAB — TROPONIN I

## 2016-08-21 LAB — CBC
HCT: 37.4 % (ref 35.0–47.0)
Hemoglobin: 12.9 g/dL (ref 12.0–16.0)
MCH: 29.7 pg (ref 26.0–34.0)
MCHC: 34.4 g/dL (ref 32.0–36.0)
MCV: 86.2 fL (ref 80.0–100.0)
PLATELETS: 364 10*3/uL (ref 150–440)
RBC: 4.34 MIL/uL (ref 3.80–5.20)
RDW: 15.9 % — AB (ref 11.5–14.5)
WBC: 10.1 10*3/uL (ref 3.6–11.0)

## 2016-08-21 LAB — BASIC METABOLIC PANEL
ANION GAP: 12 (ref 5–15)
BUN: 19 mg/dL (ref 6–20)
CALCIUM: 9.4 mg/dL (ref 8.9–10.3)
CO2: 22 mmol/L (ref 22–32)
CREATININE: 0.65 mg/dL (ref 0.44–1.00)
Chloride: 93 mmol/L — ABNORMAL LOW (ref 101–111)
Glucose, Bld: 415 mg/dL — ABNORMAL HIGH (ref 65–99)
Potassium: 3.7 mmol/L (ref 3.5–5.1)
SODIUM: 127 mmol/L — AB (ref 135–145)

## 2016-08-21 LAB — GLUCOSE, CAPILLARY: GLUCOSE-CAPILLARY: 225 mg/dL — AB (ref 65–99)

## 2016-08-21 MED ORDER — CIPROFLOXACIN HCL 500 MG PO TABS: 500.0000 mg | ORAL_TABLET | Freq: Two times a day (BID) | ORAL | 0 refills | Status: AC

## 2016-08-21 MED ORDER — AMLODIPINE BESYLATE 5 MG PO TABS
5.0000 mg | ORAL_TABLET | Freq: Once | ORAL | Status: AC
  Administered 2016-08-21: 5 mg via ORAL
  Filled 2016-08-21: qty 1

## 2016-08-21 MED ORDER — CEFTRIAXONE SODIUM-DEXTROSE 1-3.74 GM-% IV SOLR
1.0000 g | Freq: Once | INTRAVENOUS | Status: AC
  Administered 2016-08-21: 1 g via INTRAVENOUS

## 2016-08-21 MED ORDER — CEFTRIAXONE SODIUM-DEXTROSE 1-3.74 GM-% IV SOLR
INTRAVENOUS | Status: AC
  Administered 2016-08-21: 1 g via INTRAVENOUS
  Filled 2016-08-21: qty 50

## 2016-08-21 MED ORDER — INSULIN ASPART 100 UNIT/ML ~~LOC~~ SOLN
7.0000 [IU] | Freq: Once | SUBCUTANEOUS | Status: AC
  Administered 2016-08-21: 7 [IU] via INTRAVENOUS
  Filled 2016-08-21: qty 7

## 2016-08-21 MED ORDER — DEXTROSE 5 % IV SOLN: 1.0000 g | Freq: Once | INTRAVENOUS | Status: DC

## 2016-08-21 MED ORDER — SODIUM CHLORIDE 0.9 % IV BOLUS (SEPSIS)
1000.0000 mL | Freq: Once | INTRAVENOUS | Status: AC
  Administered 2016-08-21: 1000 mL via INTRAVENOUS

## 2016-08-21 NOTE — ED Triage Notes (Signed)
Was seen here 2 days ago with diagnosis of bladder infection. Blood sugar noted to be elevated at that time. Today getting 500's at home.

## 2016-08-21 NOTE — ED Provider Notes (Signed)
-----------------------------------------   9:36 PM on 08/21/2016 -----------------------------------------  Patient care assumed from Dr. Alfred Levins. Patient's blood sugar has decreased to 225. I've spoken to the patient, she is ready to be discharged home. Per Dr. Alfred Levins this plan we will discharge with ciprofloxacin. Patient will follow up with her primary care doctor. Patient agreeable to this plan.   Harvest Dark, MD 08/21/16 2136

## 2016-08-21 NOTE — ED Provider Notes (Signed)
[facility] Emergency Department Provider Note  ____________________________________________  Time seen: Approximately 7:44 PM  I have reviewed the triage vital signs and the nursing notes.   HISTORY  Chief Complaint Hyperglycemia   HPI Lauren Becker is a 76 y.o. female a history of breast cancer in remission, CHF, CAD s/p stents, diabetes, hypertension who presents for evaluation of elevated blood glucose.Patient was seen here 2 days ago with similar complaints and was found to have a UTI. Urine culture is growing Klebsiella. Patient was given a dose of Rocephin and she was sent home on Cefdnir. Patient reports that she felt better yesterday and her sugars were better controlled. Today she started feeling worse, generalized weakness, fatigue, and her BG started to go up again. Patient denies having any dysuria, hematuria, fever, chills, chest pain, shortness of breath, nausea, vomiting, diarrhea. Patient is not on insulin at home.   Past Medical History:  Diagnosis Date  . Cancer of left breast [facility]) 2014   Left Mastectomy and chemo tx's.  . CHF (congestive heart failure) ([place])   . Diabetes mellitus without complication ([place])   . Hypertension     There are no active problems to display for this patient.   Past Surgical History:  Procedure Laterality Date  . APPENDECTOMY    . BREAST SURGERY    . CARDIAC CATHETERIZATION    . CHOLECYSTECTOMY      Prior to Admission medications   Medication Sig Start Date End Date Taking? Authorizing Provider  amLODipine (NORVASC) 5 MG tablet Take 5 mg by mouth daily.    Historical Provider, MD  aspirin 325 MG tablet Take 325 mg by mouth daily.    Historical Provider, MD  ciprofloxacin (CIPRO) 500 MG tablet Take 1 tablet (500 mg total) by mouth 2 (two) times daily. 08/21/16 08/28/16  Rudene Re, MD  clopidogrel (PLAVIX) 75 MG tablet Take 75 mg by mouth daily.    Historical Provider, MD  diazepam (VALIUM) 2  MG tablet Take 2 mg by mouth every 6 (six) hours as needed for anxiety.    Historical Provider, MD  ergocalciferol (VITAMIN D2) 50000 UNITS capsule Take 50,000 Units by mouth once a week.    Historical Provider, MD  furosemide (LASIX) 20 MG tablet Take 20 mg by mouth every morning.    Historical Provider, MD  glipiZIDE (GLUCOTROL) 5 MG tablet Take by mouth daily before breakfast.    Historical Provider, MD  labetalol (NORMODYNE) 200 MG tablet Take 200 mg by mouth 2 (two) times daily.    Historical Provider, MD  lansoprazole (PREVACID) 30 MG capsule Take 30 mg by mouth daily at 12 noon.    Historical Provider, MD  losartan (COZAAR) 50 MG tablet Take 50 mg by mouth 2 (two) times daily.    Historical Provider, MD  magnesium gluconate (MAGONATE) 500 MG tablet Take 500 mg by mouth every morning.    Historical Provider, MD  potassium chloride (KLOR-CON) 20 MEQ packet Take 20 mEq by mouth 2 (two) times daily.    Historical Provider, MD  traZODone (DESYREL) 50 MG tablet Take 50 mg by mouth at bedtime.    Historical Provider, MD  zolpidem (AMBIEN) 5 MG tablet Take 5 mg by mouth at bedtime as needed for sleep.    Historical Provider, MD    Allergies Codeine and Vicodin [hydrocodone-acetaminophen]  Family History  Problem Relation Age of Onset  . Stroke Mother   . Cancer Father     Social History  Social History  Substance Use Topics  . Smoking status: Never Smoker  . Smokeless tobacco: Never Used  . Alcohol use No    Review of Systems  Constitutional: Negative for fever. + generalized weakness and fatigue Eyes: Negative for visual changes. ENT: Negative for sore throat. Neck: No neck pain  Cardiovascular: Negative for chest pain. Respiratory: Negative for shortness of breath. Gastrointestinal: Negative for abdominal pain, vomiting or diarrhea. Genitourinary: Negative for dysuria. Musculoskeletal: Negative for back pain. Skin: Negative for rash. Neurological: Negative for headaches,  weakness or numbness. Psych: No SI or HI  ____________________________________________   PHYSICAL EXAM:  VITAL SIGNS: ED Triage Vitals  Enc Vitals Group     BP 08/21/16 1741 (!) 167/82     Pulse Rate 08/21/16 1741 60     Resp 08/21/16 1741 18     Temp 08/21/16 1741 98 F (36.7 C)     Temp Source 08/21/16 1741 Oral     SpO2 08/21/16 1741 99 %     Weight 08/21/16 1744 145 lb (65.8 kg)     Height 08/21/16 1744 5\' 4"  (1.626 m)     Head Circumference --      Peak Flow --      Pain Score 08/21/16 1922 0     Pain Loc --      Pain Edu? --      Excl. in [place]? --     Constitutional: Alert and oriented. Well appearing and in no apparent distress. HEENT:      Head: Normocephalic and atraumatic.         Eyes: Conjunctivae are normal. Sclera is non-icteric. EOMI. PERRL      Mouth/Throat: Mucous membranes are moist.       Neck: Supple with no signs of meningismus. Cardiovascular: Regular rate and rhythm. No murmurs, gallops, or rubs. 2+ symmetrical distal pulses are present in all extremities. No JVD. Respiratory: Normal respiratory effort. Lungs are clear to auscultation bilaterally. No wheezes, crackles, or rhonchi.  Gastrointestinal: Soft, non tender, and non distended with positive bowel sounds. No rebound or guarding. Genitourinary: No CVA tenderness. Musculoskeletal: Nontender with normal range of motion in all extremities. No edema, cyanosis, or erythema of extremities. Neurologic: Normal speech and language. Face is symmetric. Moving all extremities. No gross focal neurologic deficits are appreciated. Skin: Skin is warm, dry and intact. No rash noted. Psychiatric: Mood and affect are normal. Speech and behavior are normal.  ____________________________________________   LABS (all labs ordered are listed, but only abnormal results are displayed)  Labs Reviewed  CBC - Abnormal; Notable for the following:       Result Value   RDW 15.9 (*)    All other components within normal  limits  BASIC METABOLIC PANEL - Abnormal; Notable for the following:    Sodium 127 (*)    Chloride 93 (*)    Glucose, Bld 415 (*)    All other components within normal limits  GLUCOSE, CAPILLARY - Abnormal; Notable for the following:    Glucose-Capillary 225 (*)    All other components within normal limits  TROPONIN I  HEMOGLOBIN A1C   ____________________________________________  EKG  ED ECG REPORT I, Rudene Re, the attending physician, personally viewed and interpreted this ECG.  Normal sinus rhythm, rate of 61, normal intervals, normal axis, no ST elevations or depressions to  ____________________________________________  RADIOLOGY  none ____________________________________________   PROCEDURES  Procedure(s) performed: None Procedures Critical Care performed:  None ____________________________________________   INITIAL IMPRESSION /  ASSESSMENT AND PLAN / ED COURSE  76 y.o. female a history of breast cancer in remission, CHF, CAD s/p stents, diabetes, hypertension who presents for evaluation of elevated blood glucose and generalized weakness. Patient seems to improve after receiving ceftriaxone. Her urine culture is growing Klebsiella. Sensitivities are not back. Patient started feeling worse again today. Her blood glucose is 415 with normal anion gap and bicarbonate and no evidence of DKA. Kidney function is within normal limits. We'll give a second dose of Rocephin, we'll give IV fluids, and 7 units of IV insulin. We'll check EKG and troponin to rule out ACS. NO evidence of sepsis with no fever, no tachycardia, and normal WBC. Patient's BP is elevated which per patient has been like this for years. She takes labetalol and losartan at home. She is suppose to be on norvasc but does not take it because she tells me it drops her BP too much. She has already taken her PM doses. Will give norvasc here.   Clinical Course as of Aug 22 1116  Fri Aug 21, 2016  2041 Patient  receiving IVF. Plan to repeat CBG and BP after fluids. If patient's CBG and BP improve and patient remains well appearing plan to dc home with close f/u with PCP. Labs with no evidence of cardiac ischemia, dehydration, or DKA. Care transferred to Dr. Kerman Passey.  [CV]    Clinical Course User Index [CV] Rudene Re, MD    Pertinent labs & imaging results that were available during my care of the patient were reviewed by me and considered in my medical decision making (see chart for details).    ____________________________________________   FINAL CLINICAL IMPRESSION(S) / ED DIAGNOSES  Final diagnoses:  Hyperglycemia      NEW MEDICATIONS STARTED DURING THIS VISIT:  Discharge Medication List as of 08/21/2016  9:36 PM    START taking these medications   Details  ciprofloxacin (CIPRO) 500 MG tablet Take 1 tablet (500 mg total) by mouth 2 (two) times daily., Starting Fri 08/21/2016, Until Fri 08/28/2016, Print         Note:  This document was prepared using Dragon voice recognition software and may include unintentional dictation errors.    Rudene Re, MD 08/22/16 [phone]

## 2016-08-22 LAB — URINE CULTURE

## 2016-08-23 ENCOUNTER — Encounter: Payer: Self-pay | Admitting: Emergency Medicine

## 2016-08-23 ENCOUNTER — Emergency Department
Admission: EM | Admit: 2016-08-23 | Discharge: 2016-08-23 | Disposition: A | Discharge status: Home / Self Care | Payer: Medicare Other | Attending: Emergency Medicine | Admitting: Emergency Medicine

## 2016-08-23 DIAGNOSIS — Z7984 Long term (current) use of oral hypoglycemic drugs: Secondary | ICD-10-CM | POA: Insufficient documentation

## 2016-08-23 DIAGNOSIS — E1165 Type 2 diabetes mellitus with hyperglycemia: Secondary | ICD-10-CM | POA: Diagnosis not present

## 2016-08-23 DIAGNOSIS — Z853 Personal history of malignant neoplasm of breast: Secondary | ICD-10-CM | POA: Diagnosis not present

## 2016-08-23 DIAGNOSIS — I509 Heart failure, unspecified: Secondary | ICD-10-CM | POA: Diagnosis not present

## 2016-08-23 DIAGNOSIS — Z7982 Long term (current) use of aspirin: Secondary | ICD-10-CM | POA: Diagnosis not present

## 2016-08-23 DIAGNOSIS — R739 Hyperglycemia, unspecified: Secondary | ICD-10-CM

## 2016-08-23 DIAGNOSIS — I110 Hypertensive heart disease with heart failure: Secondary | ICD-10-CM | POA: Diagnosis not present

## 2016-08-23 LAB — BASIC METABOLIC PANEL
Anion gap: 10 (ref 5–15)
BUN: 13 mg/dL (ref 6–20)
CALCIUM: 8.9 mg/dL (ref 8.9–10.3)
CO2: 23 mmol/L (ref 22–32)
CREATININE: 0.96 mg/dL (ref 0.44–1.00)
Chloride: 96 mmol/L — ABNORMAL LOW (ref 101–111)
GFR calc Af Amer: >60 mL/min (ref >60)
GFR, EST NON AFRICAN AMERICAN: 56 mL/min — AB (ref >60)
GLUCOSE: 520 mg/dL — AB (ref 65–99)
POTASSIUM: 3.6 mmol/L (ref 3.5–5.1)
Sodium: 129 mmol/L — ABNORMAL LOW (ref 135–145)

## 2016-08-23 LAB — URINALYSIS, COMPLETE (UACMP) WITH MICROSCOPIC
BILIRUBIN URINE: NEGATIVE
Glucose, UA: >=500 mg/dL — AB
Hgb urine dipstick: NEGATIVE
Ketones, ur: NEGATIVE mg/dL
Leukocytes, UA: NEGATIVE
NITRITE: NEGATIVE
PH: 6 (ref 5.0–8.0)
Protein, ur: NEGATIVE mg/dL
SPECIFIC GRAVITY, URINE: 1.011 (ref 1.005–1.030)

## 2016-08-23 LAB — CBC
HEMATOCRIT: 38.5 % (ref 35.0–47.0)
Hemoglobin: 13 g/dL (ref 12.0–16.0)
MCH: 29.5 pg (ref 26.0–34.0)
MCHC: 33.8 g/dL (ref 32.0–36.0)
MCV: 87.2 fL (ref 80.0–100.0)
Platelets: 317 10*3/uL (ref 150–440)
RBC: 4.42 MIL/uL (ref 3.80–5.20)
RDW: 15.7 % — AB (ref 11.5–14.5)
WBC: 5.9 10*3/uL (ref 3.6–11.0)

## 2016-08-23 LAB — HEMOGLOBIN A1C
HEMOGLOBIN A1C: 9.7 % — AB (ref 4.8–5.6)
MEAN PLASMA GLUCOSE: 232 mg/dL

## 2016-08-23 LAB — GLUCOSE, CAPILLARY
GLUCOSE-CAPILLARY: 487 mg/dL — AB (ref 65–99)
GLUCOSE-CAPILLARY: 394 mg/dL — AB (ref 65–99)

## 2016-08-23 NOTE — ED Provider Notes (Signed)
[facility] Emergency Department Provider Note  ____________________________________________   First MD Initiated Contact with Patient 08/23/16 1826     (approximate)  I have reviewed the triage vital signs and the nursing notes.   HISTORY  Chief Complaint Hyperglycemia    HPI Lauren Becker is a 76 y.o. female who self presents to the emergency department with asymptomatic hyperglycemia. This is her third visit this week for the same. Her only diabetic medication is glipizide. She formerly took metformin but has not taken it in a while and she does not know why. She currently has no appointment scheduled to see her primary care physician tomorrow. She checked her blood sugar at home today and noted it was critically high 3 times and she came to the emergency department. She does notice some increased thirst and urination. She denies chest pain or shortness of breath. She denies abdominal pain nausea or vomiting. By the time I see her she says that she feels pretty good.   Past Medical History:  Diagnosis Date  . Cancer of left breast [facility]) 2014   Left Mastectomy and chemo tx's.  . CHF (congestive heart failure) ([place])   . Diabetes mellitus without complication ([place])   . Hypertension     There are no active problems to display for this patient.   Past Surgical History:  Procedure Laterality Date  . APPENDECTOMY    . BREAST SURGERY    . CARDIAC CATHETERIZATION    . CHOLECYSTECTOMY      Prior to Admission medications   Medication Sig Start Date End Date Taking? Authorizing Provider  amLODipine (NORVASC) 5 MG tablet Take 5 mg by mouth daily.    Historical Provider, MD  aspirin 325 MG tablet Take 325 mg by mouth daily.    Historical Provider, MD  ciprofloxacin (CIPRO) 500 MG tablet Take 1 tablet (500 mg total) by mouth 2 (two) times daily. 08/21/16 08/28/16  Rudene Re, MD  clopidogrel (PLAVIX) 75 MG tablet Take 75 mg by mouth daily.     Historical Provider, MD  diazepam (VALIUM) 2 MG tablet Take 2 mg by mouth every 6 (six) hours as needed for anxiety.    Historical Provider, MD  ergocalciferol (VITAMIN D2) 50000 UNITS capsule Take 50,000 Units by mouth once a week.    Historical Provider, MD  furosemide (LASIX) 20 MG tablet Take 20 mg by mouth every morning.    Historical Provider, MD  glipiZIDE (GLUCOTROL) 5 MG tablet Take by mouth daily before breakfast.    Historical Provider, MD  labetalol (NORMODYNE) 200 MG tablet Take 200 mg by mouth 2 (two) times daily.    Historical Provider, MD  lansoprazole (PREVACID) 30 MG capsule Take 30 mg by mouth daily at 12 noon.    Historical Provider, MD  losartan (COZAAR) 50 MG tablet Take 50 mg by mouth 2 (two) times daily.    Historical Provider, MD  magnesium gluconate (MAGONATE) 500 MG tablet Take 500 mg by mouth every morning.    Historical Provider, MD  potassium chloride (KLOR-CON) 20 MEQ packet Take 20 mEq by mouth 2 (two) times daily.    Historical Provider, MD  traZODone (DESYREL) 50 MG tablet Take 50 mg by mouth at bedtime.    Historical Provider, MD  zolpidem (AMBIEN) 5 MG tablet Take 5 mg by mouth at bedtime as needed for sleep.    Historical Provider, MD    Allergies Codeine and Vicodin [hydrocodone-acetaminophen]  Family History  Problem Relation  Age of Onset  . Stroke Mother   . Cancer Father     Social History Social History  Substance Use Topics  . Smoking status: Never Smoker  . Smokeless tobacco: Never Used  . Alcohol use No    Review of Systems Constitutional: No fever/chills Eyes: No visual changes. ENT: No sore throat. Cardiovascular: Denies chest pain. Respiratory: Denies shortness of breath. Gastrointestinal: No abdominal pain.  No nausea, no vomiting.  No diarrhea.  No constipation. Genitourinary: Negative for dysuria. Musculoskeletal: Negative for back pain. Skin: Negative for rash. Neurological: Negative for headaches, focal weakness or  numbness.  10-point ROS otherwise negative.  ____________________________________________   PHYSICAL EXAM:  VITAL SIGNS: ED Triage Vitals  Enc Vitals Group     BP 08/23/16 1546 (!) 199/96     Pulse Rate 08/23/16 1546 (!) 58     Resp 08/23/16 1546 16     Temp 08/23/16 1546 97.3 F (36.3 C)     Temp Source 08/23/16 1546 Oral     SpO2 08/23/16 1546 100 %     Weight 08/23/16 1548 145 lb (65.8 kg)     Height 08/23/16 1548 5\' 4"  (1.626 m)     Head Circumference --      Peak Flow --      Pain Score --      Pain Loc --      Pain Edu? --      Excl. in [place]? --     Constitutional: Alert and oriented x 4 well appearing nontoxic no diaphoresis speaks in full, clear sentences Eyes: PERRL EOMI. Head: Atraumatic. Nose: No congestion/rhinnorhea. Mouth/Throat: No trismus Neck: No stridor.   Cardiovascular: Normal rate, regular rhythm. Grossly normal heart sounds.  Good peripheral circulation. Respiratory: Normal respiratory effort.  No retractions. Lungs CTAB and moving good air Gastrointestinal: Soft nondistended nontender no rebound no guarding no peritonitis no McBurney's tenderness negative Rovsing's no costovertebral tenderness negative Murphy's Musculoskeletal: No lower extremity edema   Neurologic:  Normal speech and language. No gross focal neurologic deficits are appreciated. Skin:  Skin is warm, dry and intact. No rash noted. Psychiatric: Mood and affect are normal. Speech and behavior are normal.    ____________________________________________   DIFFERENTIAL  Ardelle Anton hyperglycemic state, diabetic ketoacidosis, asymptomatic hyperglycemia, renal dysfunction ____________________________________________   LABS (all labs ordered are listed, but only abnormal results are displayed)  Labs Reviewed  GLUCOSE, CAPILLARY - Abnormal; Notable for the following:       Result Value   Glucose-Capillary 487 (*)    All other components within normal limits  BASIC METABOLIC  PANEL - Abnormal; Notable for the following:    Sodium 129 (*)    Chloride 96 (*)    Glucose, Bld 520 (*)    GFR calc non Af Amer 56 (*)    All other components within normal limits  CBC - Abnormal; Notable for the following:    RDW 15.7 (*)    All other components within normal limits  URINALYSIS, COMPLETE (UACMP) WITH MICROSCOPIC - Abnormal; Notable for the following:    Color, Urine YELLOW (*)    APPearance CLEAR (*)    Glucose, UA >=500 (*)    Bacteria, UA RARE (*)    Squamous Epithelial / LPF 0-5 (*)    All other components within normal limits  GLUCOSE, CAPILLARY - Abnormal; Notable for the following:    Glucose-Capillary 394 (*)    All other components within normal limits  CBG MONITORING, ED  No evidence of anion gap or diabetic ketoacidosis __________________________________________  EKG   ____________________________________________  RADIOLOGY   ____________________________________________   PROCEDURES  Procedure(s) performed: no  Procedures  Critical Care performed: no  ____________________________________________   INITIAL IMPRESSION / ASSESSMENT AND PLAN / ED COURSE  Pertinent labs & imaging results that were available during my care of the patient were reviewed by me and considered in my medical decision making (see chart for details).  By the time I saw the patient she was asymptomatic. She has no signs of DKA or HHS. She is able to see her primary care physician first thing in the morning. I would not add any other medications on now. She is stable for outpatient management understands and agrees the plan.      ____________________________________________   FINAL CLINICAL IMPRESSION(S) / ED DIAGNOSES  Final diagnoses:  Hyperglycemia      NEW MEDICATIONS STARTED DURING THIS VISIT:  Discharge Medication List as of 08/23/2016  6:49 PM       Note:  This document was prepared using Dragon voice recognition software and may include  unintentional dictation errors.     Darel Hong, MD 08/23/16 2017

## 2016-08-23 NOTE — ED Notes (Signed)
CBG 487

## 2016-08-23 NOTE — ED Triage Notes (Signed)
Per family bs was reading over 600 at home. Has been having trouble controlling her blood sugar and blood pressure recently. Is on oral dm meds only and is suppose to see her pcp this week about going on shots.

## 2016-08-23 NOTE — Discharge Instructions (Signed)
You must follow-up with your primary care physician tomorrow for a recheck. Return to the emergency department for any new or worsening symptoms such as chest pain, shortness of breath, or for any other concerns. Do not change your medications tonight, your doctor will likely alter them tomorrow.  It was a pleasure to take care of you today, and thank you for coming to our emergency department.  If you have any questions or concerns before leaving please ask the nurse to grab me and I'm more than happy to go through your aftercare instructions again.  If you were prescribed any opioid pain medication today such as Norco, Vicodin, Percocet, morphine, hydrocodone, or oxycodone please make sure you do not drive when you are taking this medication as it can alter your ability to drive safely.  If you have any concerns once you are home that you are not improving or are in fact getting worse before you can make it to your follow-up appointment, please do not hesitate to call 911 and come back for further evaluation.  Darel Hong MD  Results for orders placed or performed during the hospital encounter of 08/23/16  Glucose, capillary  Result Value Ref Range   Glucose-Capillary 487 (H) 65 - 99 mg/dL  Basic metabolic panel  Result Value Ref Range   Sodium 129 (L) 135 - 145 mmol/L   Potassium 3.6 3.5 - 5.1 mmol/L   Chloride 96 (L) 101 - 111 mmol/L   CO2 23 22 - 32 mmol/L   Glucose, Bld 520 (HH) 65 - 99 mg/dL   BUN 13 6 - 20 mg/dL   Creatinine, Ser 0.96 0.44 - 1.00 mg/dL   Calcium 8.9 8.9 - 10.3 mg/dL   GFR calc non Af Amer 56 (L) >60 mL/min   GFR calc Af Amer >60 >60 mL/min   Anion gap 10 5 - 15  CBC  Result Value Ref Range   WBC 5.9 3.6 - 11.0 K/uL   RBC 4.42 3.80 - 5.20 MIL/uL   Hemoglobin 13.0 12.0 - 16.0 g/dL   HCT 38.5 35.0 - 47.0 %   MCV 87.2 80.0 - 100.0 fL   MCH 29.5 26.0 - 34.0 pg   MCHC 33.8 32.0 - 36.0 g/dL   RDW 15.7 (H) 11.5 - 14.5 %   Platelets 317 150 - 440 K/uL    Urinalysis, Complete w Microscopic  Result Value Ref Range   Color, Urine YELLOW (A) YELLOW   APPearance CLEAR (A) CLEAR   Specific Gravity, Urine 1.011 1.005 - 1.030   pH 6.0 5.0 - 8.0   Glucose, UA >=500 (A) NEGATIVE mg/dL   Hgb urine dipstick NEGATIVE NEGATIVE   Bilirubin Urine NEGATIVE NEGATIVE   Ketones, ur NEGATIVE NEGATIVE mg/dL   Protein, ur NEGATIVE NEGATIVE mg/dL   Nitrite NEGATIVE NEGATIVE   Leukocytes, UA NEGATIVE NEGATIVE   RBC / HPF 0-5 0 - 5 RBC/hpf   WBC, UA 0-5 0 - 5 WBC/hpf   Bacteria, UA RARE (A) NONE SEEN   Squamous Epithelial / LPF 0-5 (A) NONE SEEN  Glucose, capillary  Result Value Ref Range   Glucose-Capillary 394 (H) 65 - 99 mg/dL   US Abdomen Complete W/elastography  Result Date: 08/17/2016 CLINICAL DATA:  Elevated liver function studies. EXAM: ULTRASOUND ABDOMEN ULTRASOUND HEPATIC ELASTOGRAPHY TECHNIQUE: Sonography of the upper abdomen was performed. In addition, ultrasound elastography evaluation of the liver was performed. A region of interest was placed within the right lobe of the liver. Following application of a  compressive sonographic pulse, shear waves were detected in the adjacent hepatic tissue and the shear wave velocity was calculated. Multiple assessments were performed at the selected site. Median shear wave velocity is correlated to a Metavir fibrosis score. COMPARISON:  CT scan 06/08/2011 FINDINGS: ULTRASOUND ABDOMEN Gallbladder: Surgically absent Common bile duct: Diameter: 10.2 mm Liver: Diffuse increased echogenicity of the liver with decreased through transmission suggesting fatty infiltration or hepatocellular disease. No focal hepatic lesions or intrahepatic biliary dilatation. IVC: Normal caliber. Pancreas: Sonographically normal. Spleen: Normal size.  No focal lesions. Right Kidney: Length: 10.7 cm. Normal renal cortical thickness and echogenicity without focal lesions or hydronephrosis. Left Kidney: Length: 10.7 cm. Normal renal cortical  thickness and echogenicity without focal lesions or hydronephrosis. Abdominal aorta: Normal caliber Other findings: None. ULTRASOUND HEPATIC ELASTOGRAPHY Device: Siemens Helix VTQ Patient position: Supine Transducer 6C1 Number of measurements: 10 Hepatic segment:  8 Median velocity:   3.11  m/sec IQR: 0.28 IQR/Median velocity ratio: 0.09 Corresponding Metavir fibrosis score:  Some F3 + F4 Risk of fibrosis: High Limitations of exam: None Pertinent findings noted on other imaging exams:  None Please note that abnormal shear wave velocities may also be identified in clinical settings other than with hepatic fibrosis, such as: acute hepatitis, elevated right heart and central venous pressures including use of beta blockers, veno-occlusive disease (Budd-Chiari), infiltrative processes such as mastocytosis/amyloidosis/infiltrative tumor, extrahepatic cholestasis, in the post-prandial state, and liver transplantation. Correlation with patient history, laboratory data, and clinical condition recommended. IMPRESSION: ULTRASOUND ABDOMEN: 1. Status post cholecystectomy. 2. Common bile duct dilatation likely due to prior cholecystectomy. 3. Increased echogenicity of the liver with poor through transmission suggesting fatty infiltration or hepatocellular disease. ULTRASOUND HEPATIC ELASTOGRAPHY: Median hepatic shear wave velocity is calculated at 3.11 m/sec. Corresponding Metavir fibrosis score is  Some F3 + F4. Risk of fibrosis is High. Follow-up: Follow up advised Electronically Signed   By: Marijo Sanes M.D.   On: 08/17/2016 10:37

## 2016-08-24 ENCOUNTER — Telehealth: Payer: Self-pay | Admitting: Emergency Medicine

## 2016-08-24 NOTE — Telephone Encounter (Signed)
Called patient to inform of hgb a1c result.  Left message.

## 2017-02-11 ENCOUNTER — Other Ambulatory Visit: Payer: Self-pay | Admitting: Family Medicine

## 2017-02-22 DEATH — deceased

## 2017-10-09 IMAGING — US US ABDOMEN COMPLETE W/ ELASTOGRAPHY
1 series · 13 of 25 positions shown · non-contrast
Comparison: CT scan 06/08/2011

CLINICAL DATA: Elevated liver function studies.



[Series 1: us abdomen complete w/ elastography · 0.22mm/px · 13 of 113 slices shown]
[im 1/113]
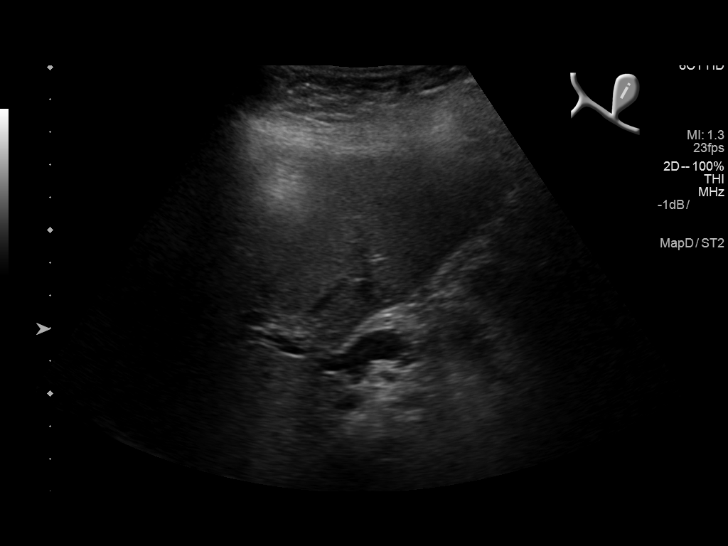
[im 10/113]
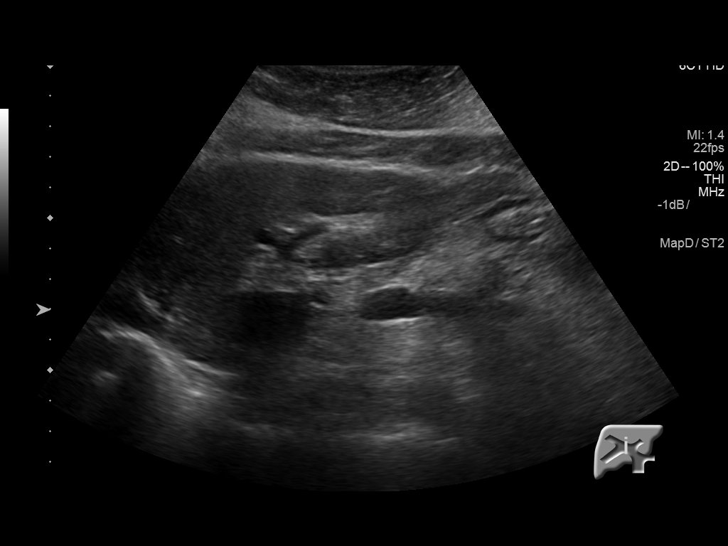
[im 19/113]
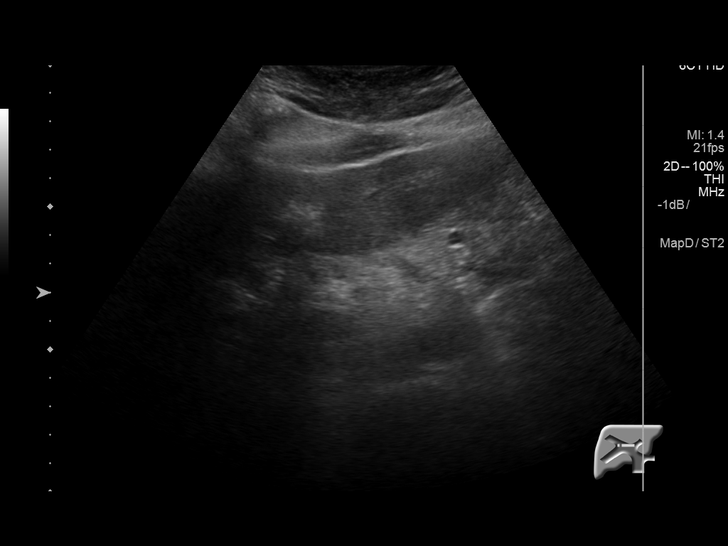
[im 29/113]
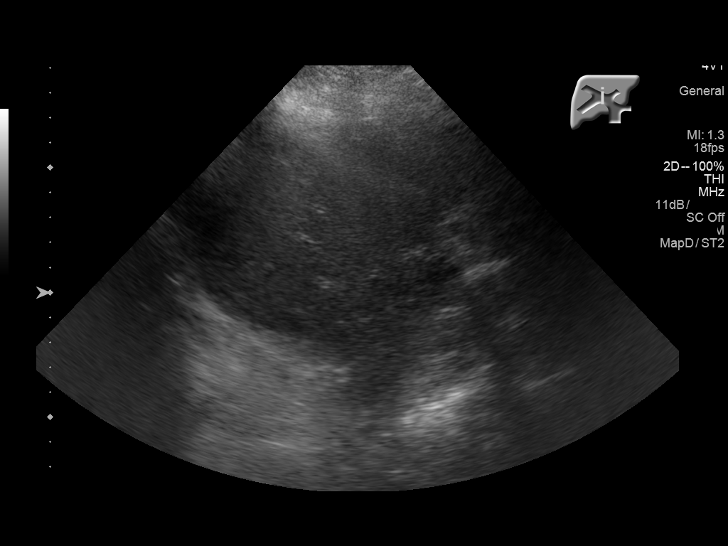
[im 38/113]
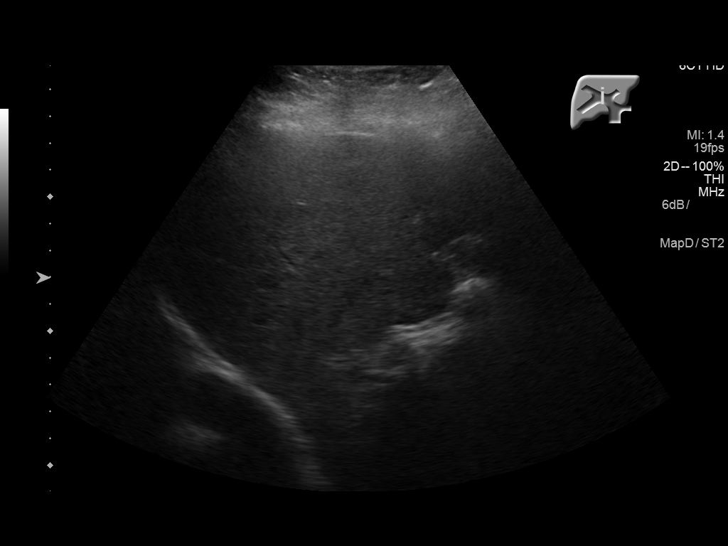
[im 47/113]
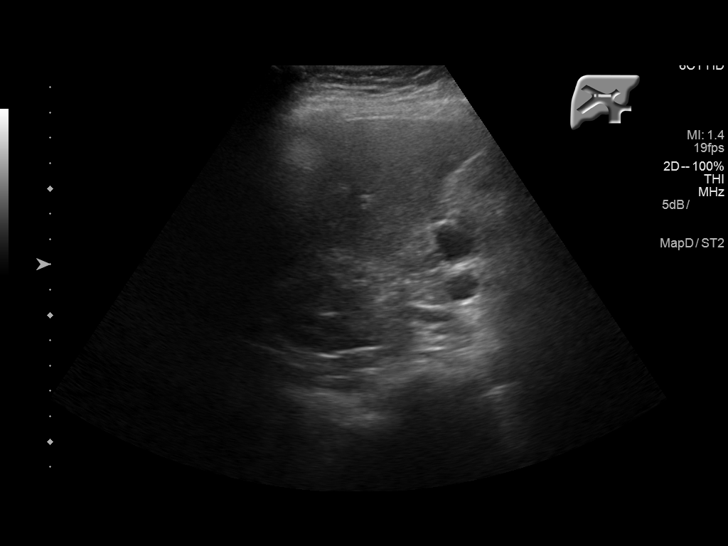
[im 57/113]
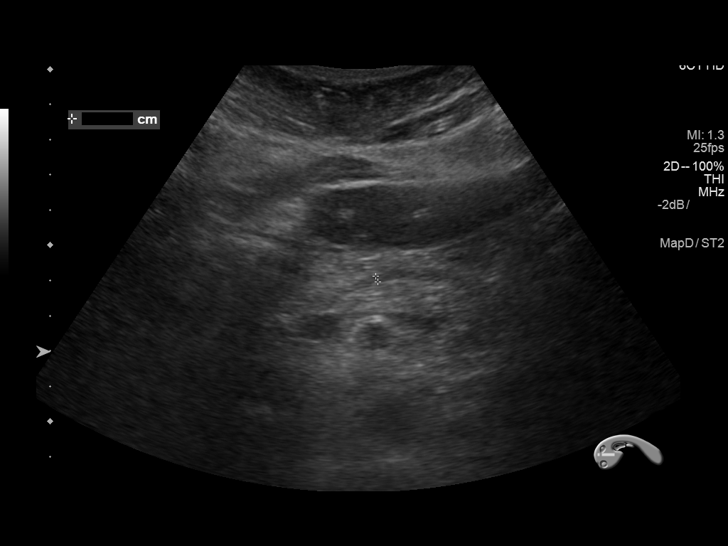
[im 66/113]
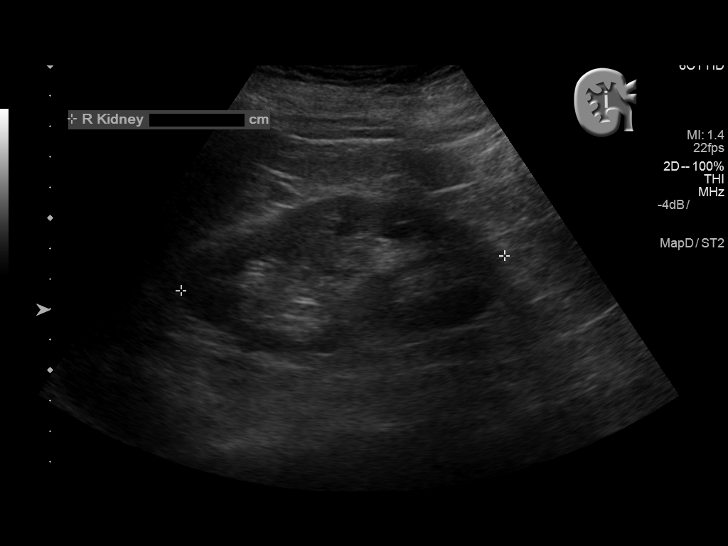
[im 75/113]
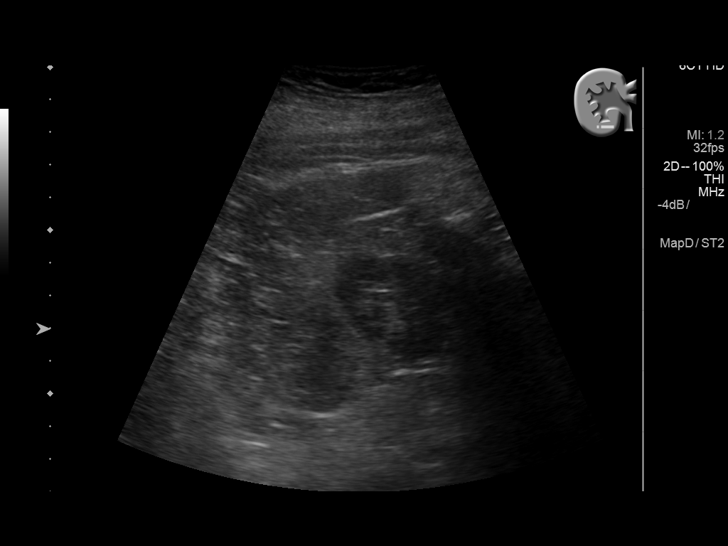
[im 85/113]
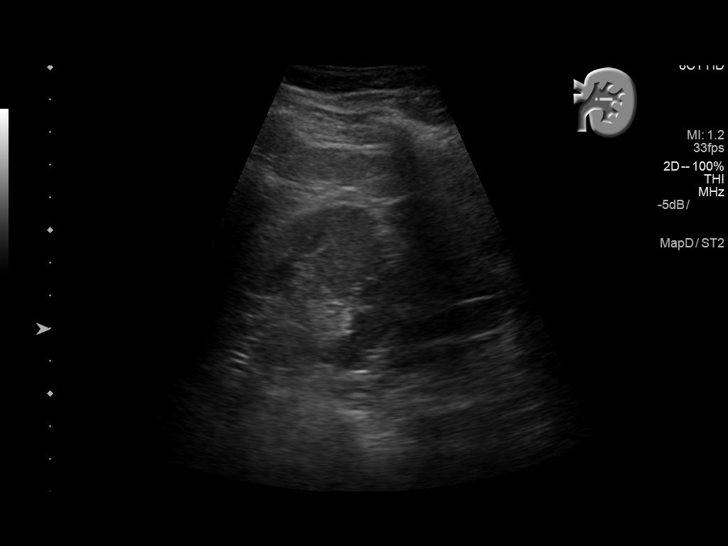
[im 94/113]
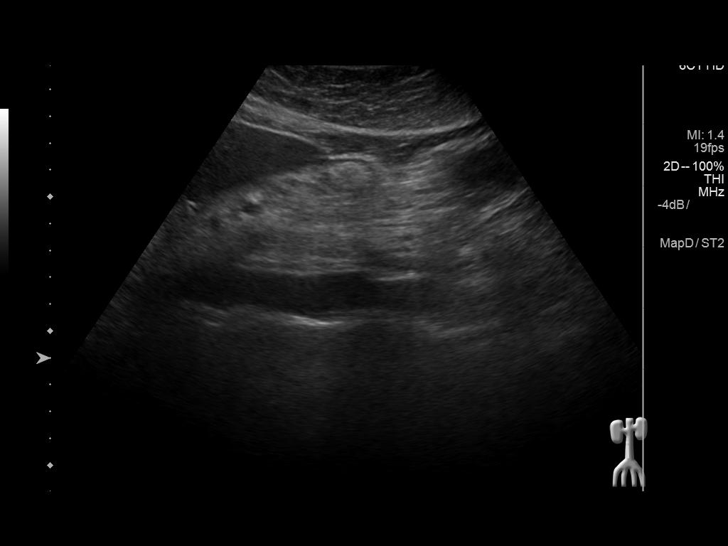
[im 103/113]
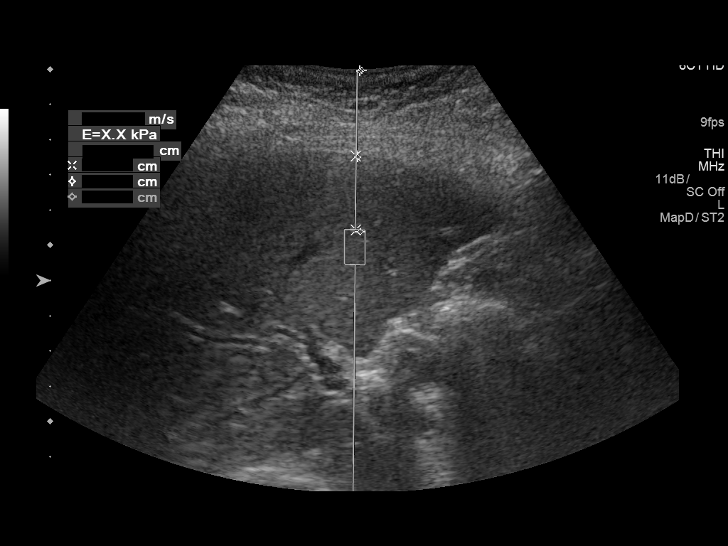
[im 113/113]
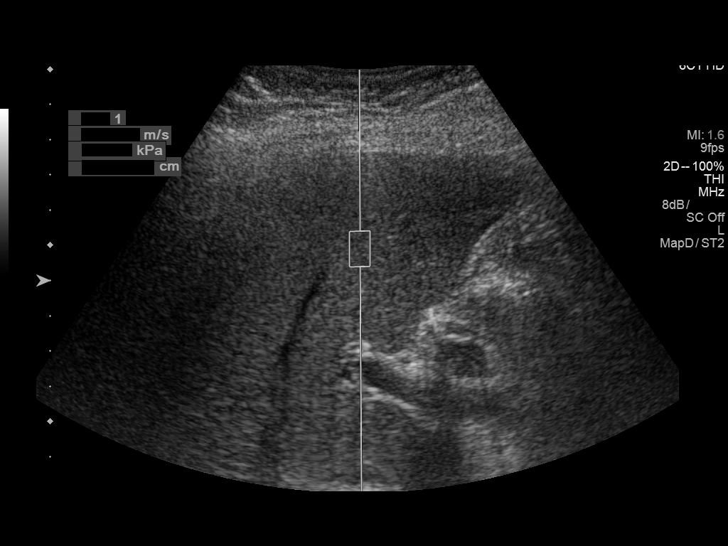

[13 of 25 positions shown; findings below may reference images not displayed]

FINDINGS: ULTRASOUND ABDOMEN

Gallbladder: Surgically absent

Common bile duct: Diameter: 10.2 mm

Liver: Diffuse increased echogenicity of the liver with decreased
through transmission suggesting fatty infiltration or hepatocellular
disease. No focal hepatic lesions or intrahepatic biliary
dilatation.

IVC: Normal caliber.

Pancreas: Sonographically normal.

Spleen: Normal size.  No focal lesions.

Right Kidney: Length: 10.7 cm. Normal renal cortical thickness and
echogenicity without focal lesions or hydronephrosis.

Left Kidney: Length: 10.7 cm. Normal renal cortical thickness and
echogenicity without focal lesions or hydronephrosis.

Abdominal aorta: Normal caliber

Other findings: None.

ULTRASOUND HEPATIC ELASTOGRAPHY

Device: Siemens Helix VTQ

Patient position: Supine

Transducer [id number]

Number of measurements: 10

Hepatic segment:  8

Median velocity:   3.11  m/sec

IQR:

IQR/Median velocity ratio:

Corresponding Metavir fibrosis score:  Some F3 + F4

Risk of fibrosis: High

Limitations of exam: None

Pertinent findings noted on other imaging exams:  None

Please note that abnormal shear wave velocities may also be
identified in clinical settings other than with hepatic fibrosis,
such as: acute hepatitis, elevated right heart and central venous
pressures including use of beta blockers, Jerzy Jan disease
(Sofrizal), infiltrative processes such as
mastocytosis/amyloidosis/infiltrative tumor, extrahepatic
cholestasis, in the post-prandial state, and liver transplantation.
Correlation with patient history, laboratory data, and clinical
condition recommended.
IMPRESSION: ULTRASOUND ABDOMEN:

1. Status post cholecystectomy.
2. Common bile duct dilatation likely due to prior cholecystectomy.
3. Increased echogenicity of the liver with poor through
transmission suggesting fatty infiltration or hepatocellular
disease.

ULTRASOUND HEPATIC ELASTOGRAPHY:

Median hepatic shear wave velocity is calculated at 3.11 m/sec.

Corresponding Metavir fibrosis score is  Some F3 + F4.

Risk of fibrosis is High.

Follow-up: Follow up advised

## 2018-04-15 IMAGING — CR DG CHEST 2V
2 series · 2 of 2 positions shown · non-contrast
Comparison: PA and lateral chest x-ray April 26, 2014

CLINICAL DATA: One day of shortness of breath. History of left
breast malignancy with mastectomy and chemotherapy.

EXAM:
CHEST  2 VIEW

[chest pa]
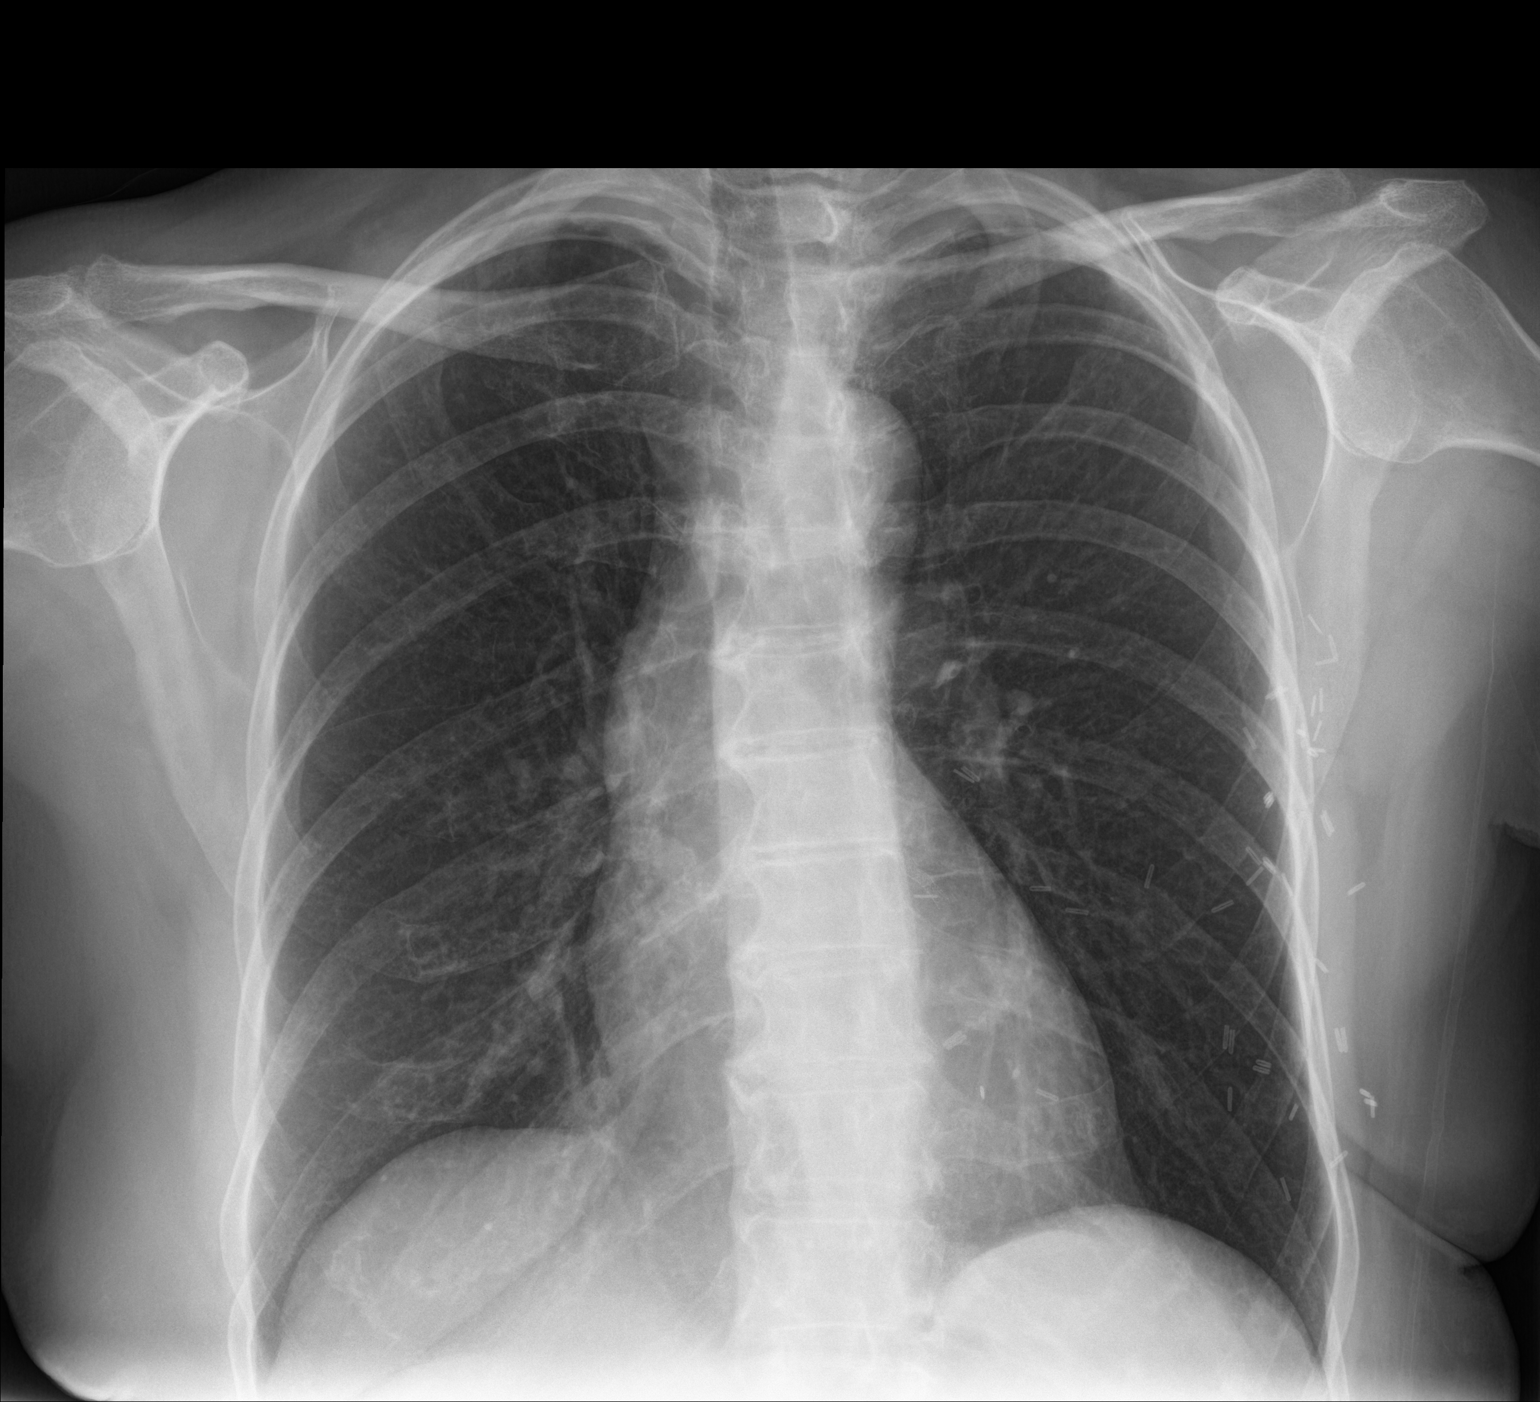

[chest lat]
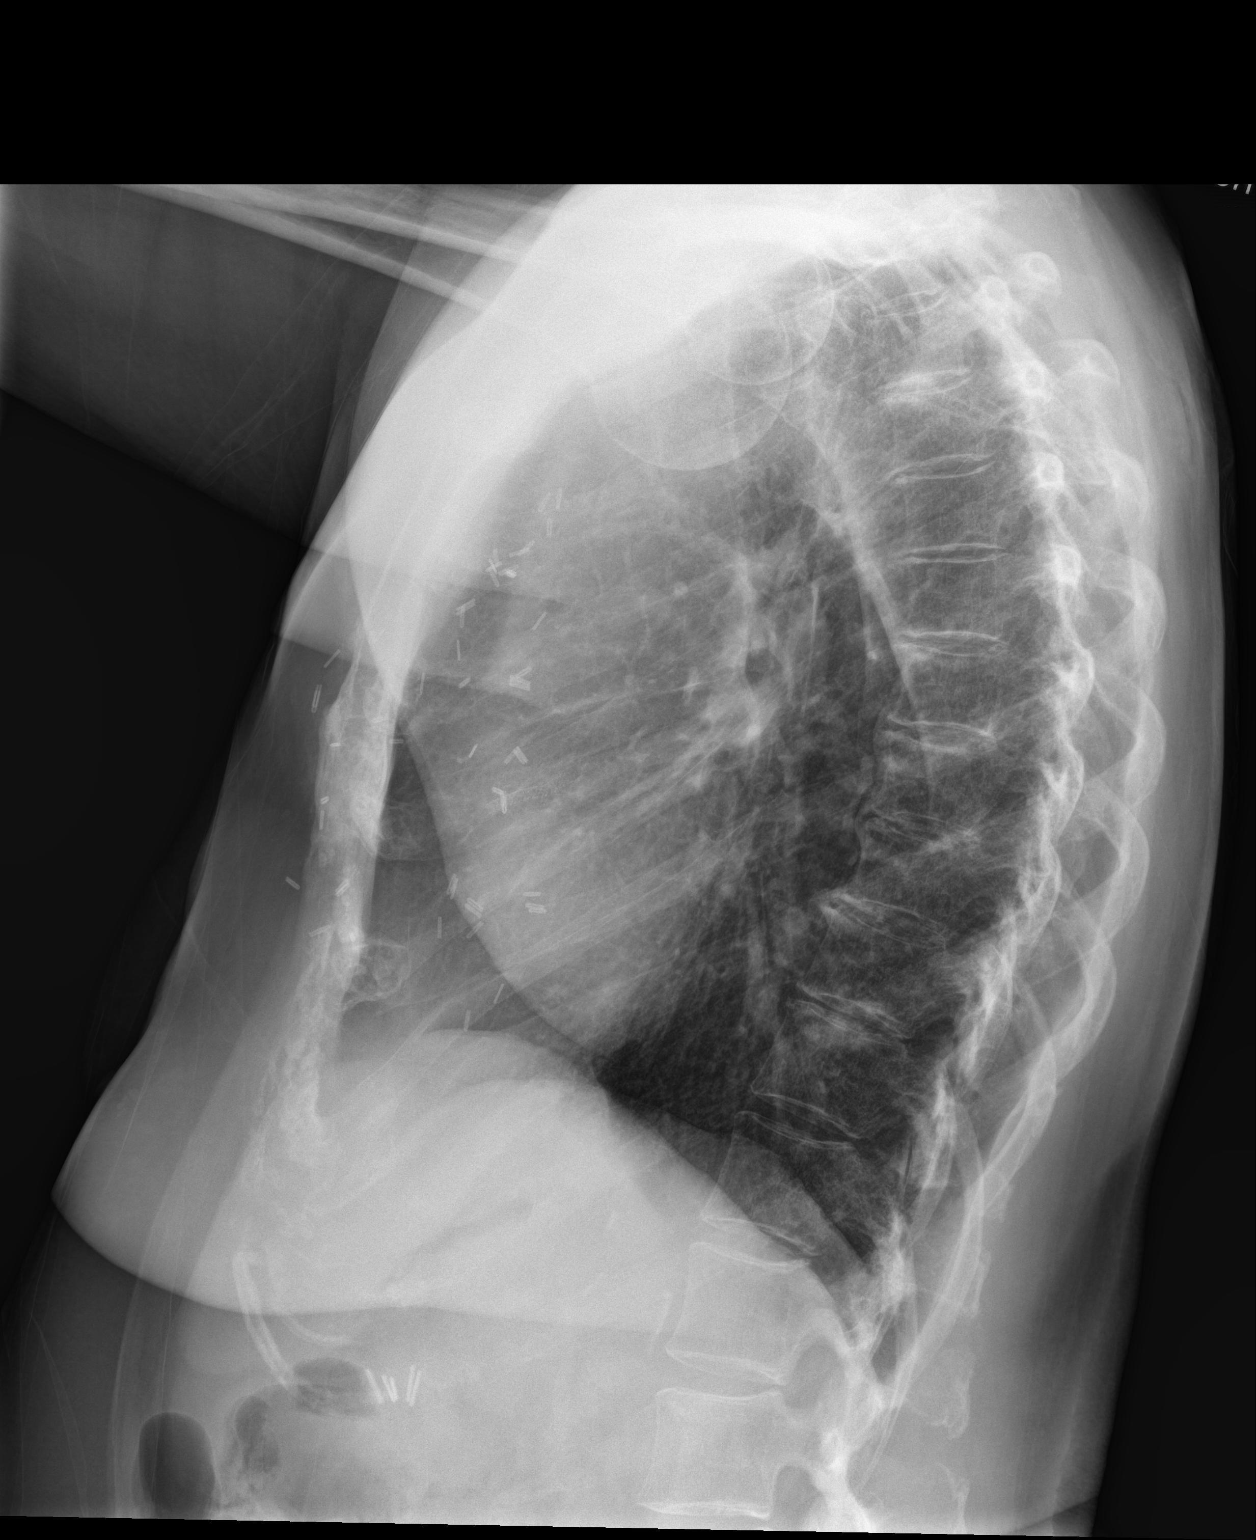

[2 of 2 positions shown; findings below may reference images not displayed]

FINDINGS: The lungs are well-expanded and clear. The heart and pulmonary
vascularity are normal. The mediastinum is normal in width. There is
no pleural effusion. The left breast shadow is absent. The patient
has undergone previous left axillary lymph node dissection. The bony
thorax exhibits no acute abnormality.
IMPRESSION: There is no acute cardiopulmonary abnormality. There are no findings
suspicious for metastatic disease to the lungs.
